# Patient Record
Sex: Male | Born: 1950 | Race: Black or African American | Hispanic: No | Marital: Single | Smoking: Current every day smoker
Health system: Southern US, Community
[De-identification: ages and names within clinical notes are randomized; demographics above are authoritative.]

## PROBLEM LIST (undated history)

## (undated) DIAGNOSIS — I252 Old myocardial infarction: Secondary | ICD-10-CM

## (undated) DIAGNOSIS — W3400XA Accidental discharge from unspecified firearms or gun, initial encounter: Secondary | ICD-10-CM

## (undated) DIAGNOSIS — J42 Unspecified chronic bronchitis: Secondary | ICD-10-CM

## (undated) DIAGNOSIS — J449 Chronic obstructive pulmonary disease, unspecified: Secondary | ICD-10-CM

## (undated) DIAGNOSIS — I639 Cerebral infarction, unspecified: Secondary | ICD-10-CM

## (undated) HISTORY — PX: BACK SURGERY: SHX140

## (undated) HISTORY — PX: ABDOMINAL SURGERY: SHX537

---

## 2019-09-03 ENCOUNTER — Emergency Department (HOSPITAL_COMMUNITY): Payer: Medicare Other

## 2019-09-03 ENCOUNTER — Other Ambulatory Visit: Payer: Self-pay

## 2019-09-03 ENCOUNTER — Emergency Department (HOSPITAL_COMMUNITY)
Admission: EM | Admit: 2019-09-03 | Discharge: 2019-09-03 | Disposition: A | Discharge status: Home / Self Care | Payer: Medicare Other | Attending: Emergency Medicine | Admitting: Emergency Medicine

## 2019-09-03 ENCOUNTER — Encounter (HOSPITAL_COMMUNITY): Payer: Self-pay

## 2019-09-03 DIAGNOSIS — J449 Chronic obstructive pulmonary disease, unspecified: Secondary | ICD-10-CM | POA: Insufficient documentation

## 2019-09-03 DIAGNOSIS — F17210 Nicotine dependence, cigarettes, uncomplicated: Secondary | ICD-10-CM | POA: Diagnosis not present

## 2019-09-03 DIAGNOSIS — R109 Unspecified abdominal pain: Secondary | ICD-10-CM | POA: Diagnosis not present

## 2019-09-03 HISTORY — DX: Cerebral infarction, unspecified: I63.9

## 2019-09-03 HISTORY — DX: Chronic obstructive pulmonary disease, unspecified: J44.9

## 2019-09-03 HISTORY — DX: Old myocardial infarction: I25.2

## 2019-09-03 HISTORY — DX: Unspecified chronic bronchitis: J42

## 2019-09-03 HISTORY — DX: Accidental discharge from unspecified firearms or gun, initial encounter: W34.00XA

## 2019-09-03 LAB — URINALYSIS, ROUTINE W REFLEX MICROSCOPIC
Bilirubin Urine: NEGATIVE
Glucose, UA: NEGATIVE mg/dL
Hgb urine dipstick: NEGATIVE
Ketones, ur: NEGATIVE mg/dL
Leukocytes,Ua: NEGATIVE
Nitrite: NEGATIVE
Protein, ur: NEGATIVE mg/dL
Specific Gravity, Urine: 1.01 (ref 1.005–1.030)
pH: 6 (ref 5.0–8.0)

## 2019-09-03 LAB — CBC WITH DIFFERENTIAL/PLATELET
Abs Immature Granulocytes: 0.02 10*3/uL (ref 0.00–0.07)
Basophils Absolute: 0 10*3/uL (ref 0.0–0.1)
Basophils Relative: 0 %
Eosinophils Absolute: 0.1 10*3/uL (ref 0.0–0.5)
Eosinophils Relative: 2 %
HCT: 39.4 % (ref 39.0–52.0)
Hemoglobin: 13.2 g/dL (ref 13.0–17.0)
Immature Granulocytes: 0 %
Lymphocytes Relative: 34 %
Lymphs Abs: 2.3 10*3/uL (ref 0.7–4.0)
MCH: 30.6 pg (ref 26.0–34.0)
MCHC: 33.5 g/dL (ref 30.0–36.0)
MCV: 91.2 fL (ref 80.0–100.0)
Monocytes Absolute: 1 10*3/uL (ref 0.1–1.0)
Monocytes Relative: 15 %
Neutro Abs: 3.2 10*3/uL (ref 1.7–7.7)
Neutrophils Relative %: 49 %
Platelets: 165 10*3/uL (ref 150–400)
RBC: 4.32 MIL/uL (ref 4.22–5.81)
RDW: 14.3 % (ref 11.5–15.5)
WBC: 6.7 10*3/uL (ref 4.0–10.5)
nRBC: 0 % (ref 0.0–0.2)

## 2019-09-03 LAB — BASIC METABOLIC PANEL
Anion gap: 9 (ref 5–15)
BUN: 12 mg/dL (ref 8–23)
CO2: 27 mmol/L (ref 22–32)
Calcium: 8.9 mg/dL (ref 8.9–10.3)
Chloride: 107 mmol/L (ref 98–111)
Creatinine, Ser: 0.71 mg/dL (ref 0.61–1.24)
GFR calc Af Amer: >60 mL/min (ref >60)
GFR calc non Af Amer: >60 mL/min (ref >60)
Glucose, Bld: 83 mg/dL (ref 70–99)
Potassium: 3.5 mmol/L (ref 3.5–5.1)
Sodium: 143 mmol/L (ref 135–145)

## 2019-09-03 MED ORDER — IBUPROFEN 800 MG PO TABS
800.0000 mg | ORAL_TABLET | Freq: Once | ORAL | Status: AC
  Administered 2019-09-03: 800 mg via ORAL
  Filled 2019-09-03: qty 1
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
## 2019-09-09 ENCOUNTER — Other Ambulatory Visit: Payer: Self-pay

## 2019-09-09 ENCOUNTER — Encounter (HOSPITAL_COMMUNITY): Payer: Self-pay

## 2019-09-09 ENCOUNTER — Emergency Department (HOSPITAL_COMMUNITY): Payer: Medicare Other

## 2019-09-09 ENCOUNTER — Emergency Department (HOSPITAL_COMMUNITY)
Admission: EM | Admit: 2019-09-09 | Discharge: 2019-09-09 | Disposition: A | Discharge status: Home / Self Care | Payer: Medicare Other | Attending: Emergency Medicine | Admitting: Emergency Medicine

## 2019-09-09 DIAGNOSIS — R1012 Left upper quadrant pain: Secondary | ICD-10-CM | POA: Diagnosis not present

## 2019-09-09 DIAGNOSIS — J449 Chronic obstructive pulmonary disease, unspecified: Secondary | ICD-10-CM | POA: Diagnosis not present

## 2019-09-09 DIAGNOSIS — M549 Dorsalgia, unspecified: Secondary | ICD-10-CM | POA: Diagnosis not present

## 2019-09-09 DIAGNOSIS — I252 Old myocardial infarction: Secondary | ICD-10-CM | POA: Insufficient documentation

## 2019-09-09 DIAGNOSIS — Z8673 Personal history of transient ischemic attack (TIA), and cerebral infarction without residual deficits: Secondary | ICD-10-CM | POA: Insufficient documentation

## 2019-09-09 DIAGNOSIS — F17210 Nicotine dependence, cigarettes, uncomplicated: Secondary | ICD-10-CM | POA: Insufficient documentation

## 2019-09-09 DIAGNOSIS — R109 Unspecified abdominal pain: Secondary | ICD-10-CM

## 2019-09-09 LAB — CBC
HCT: 40 % (ref 39.0–52.0)
Hemoglobin: 13.7 g/dL (ref 13.0–17.0)
MCH: 31.5 pg (ref 26.0–34.0)
MCHC: 34.3 g/dL (ref 30.0–36.0)
MCV: 92 fL (ref 80.0–100.0)
Platelets: 139 10*3/uL — ABNORMAL LOW (ref 150–400)
RBC: 4.35 MIL/uL (ref 4.22–5.81)
RDW: 14.7 % (ref 11.5–15.5)
WBC: 9.8 10*3/uL (ref 4.0–10.5)
nRBC: 0 % (ref 0.0–0.2)

## 2019-09-09 LAB — COMPREHENSIVE METABOLIC PANEL
ALT: 13 U/L (ref 0–44)
AST: 21 U/L (ref 15–41)
Albumin: 3.3 g/dL — ABNORMAL LOW (ref 3.5–5.0)
Alkaline Phosphatase: 67 U/L (ref 38–126)
Anion gap: 8 (ref 5–15)
BUN: 10 mg/dL (ref 8–23)
CO2: 23 mmol/L (ref 22–32)
Calcium: 8.5 mg/dL — ABNORMAL LOW (ref 8.9–10.3)
Chloride: 110 mmol/L (ref 98–111)
Creatinine, Ser: 0.72 mg/dL (ref 0.61–1.24)
GFR calc Af Amer: >60 mL/min (ref >60)
GFR calc non Af Amer: >60 mL/min (ref >60)
Glucose, Bld: 102 mg/dL — ABNORMAL HIGH (ref 70–99)
Potassium: 4.7 mmol/L (ref 3.5–5.1)
Sodium: 141 mmol/L (ref 135–145)
Total Bilirubin: 0.7 mg/dL (ref 0.3–1.2)
Total Protein: 6.9 g/dL (ref 6.5–8.1)

## 2019-09-09 LAB — URINALYSIS, ROUTINE W REFLEX MICROSCOPIC
Bilirubin Urine: NEGATIVE
Glucose, UA: NEGATIVE mg/dL
Hgb urine dipstick: NEGATIVE
Ketones, ur: NEGATIVE mg/dL
Leukocytes,Ua: NEGATIVE
Nitrite: NEGATIVE
Protein, ur: NEGATIVE mg/dL
Specific Gravity, Urine: 1.02 (ref 1.005–1.030)
pH: 7 (ref 5.0–8.0)

## 2019-09-09 LAB — LIPASE, BLOOD: Lipase: 183 U/L — ABNORMAL HIGH (ref 11–51)

## 2019-09-09 MED ORDER — IOHEXOL 300 MG/ML  SOLN
100.0000 mL | Freq: Once | INTRAMUSCULAR | Status: AC | PRN
  Administered 2019-09-09: 100 mL via INTRAVENOUS

## 2019-09-09 MED ORDER — PREDNISONE 50 MG PO TABS
ORAL_TABLET | ORAL | 0 refills | Status: DC
Start: 2019-09-09 — End: 2019-09-16

## 2019-09-09 MED ORDER — METHOCARBAMOL 1000 MG/10ML IJ SOLN
1000.0000 mg | Freq: Once | INTRAVENOUS | Status: AC
  Administered 2019-09-09: 1000 mg via INTRAVENOUS
  Filled 2019-09-09: qty 1000

## 2019-09-09 MED ORDER — SODIUM CHLORIDE (PF) 0.9 % IJ SOLN
INTRAMUSCULAR | Status: AC
  Filled 2019-09-09: qty 50

## 2019-09-09 MED ORDER — KETOROLAC TROMETHAMINE 30 MG/ML IJ SOLN
15.0000 mg | Freq: Once | INTRAMUSCULAR | Status: AC
  Administered 2019-09-09: 15 mg via INTRAVENOUS
  Filled 2019-09-09: qty 1

## 2019-09-09 MED ORDER — SODIUM CHLORIDE 0.9% FLUSH
3.0000 mL | Freq: Once | INTRAVENOUS | Status: AC
  Administered 2019-09-09: 3 mL via INTRAVENOUS
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
## 2019-09-09 NOTE — ED Triage Notes (Signed)
Patient c/o left upper abdominal pain that has been worsening x 3 weeks. Patient also c/o N/D.

## 2019-09-14 ENCOUNTER — Ambulatory Visit (HOSPITAL_COMMUNITY)
Admission: RE | Admit: 2019-09-14 | Discharge: 2019-09-14 | Disposition: A | Discharge status: Home / Self Care | Payer: Medicare Other | Source: Home / Self Care | Attending: Psychiatry | Admitting: Psychiatry

## 2019-09-14 ENCOUNTER — Encounter (HOSPITAL_COMMUNITY): Payer: Self-pay | Admitting: *Deleted

## 2019-09-14 ENCOUNTER — Other Ambulatory Visit: Payer: Self-pay

## 2019-09-14 ENCOUNTER — Emergency Department (HOSPITAL_COMMUNITY)
Admission: EM | Admit: 2019-09-14 | Discharge: 2019-09-15 | Disposition: A | Discharge status: Other Acute Inpatient Hospital | Payer: Medicare Other | Attending: Emergency Medicine | Admitting: Emergency Medicine

## 2019-09-14 DIAGNOSIS — F314 Bipolar disorder, current episode depressed, severe, without psychotic features: Secondary | ICD-10-CM | POA: Insufficient documentation

## 2019-09-14 DIAGNOSIS — J449 Chronic obstructive pulmonary disease, unspecified: Secondary | ICD-10-CM | POA: Insufficient documentation

## 2019-09-14 DIAGNOSIS — F17210 Nicotine dependence, cigarettes, uncomplicated: Secondary | ICD-10-CM | POA: Insufficient documentation

## 2019-09-14 DIAGNOSIS — Z20822 Contact with and (suspected) exposure to covid-19: Secondary | ICD-10-CM | POA: Insufficient documentation

## 2019-09-14 DIAGNOSIS — I10 Essential (primary) hypertension: Secondary | ICD-10-CM | POA: Insufficient documentation

## 2019-09-14 DIAGNOSIS — Z8673 Personal history of transient ischemic attack (TIA), and cerebral infarction without residual deficits: Secondary | ICD-10-CM | POA: Insufficient documentation

## 2019-09-14 DIAGNOSIS — Z79899 Other long term (current) drug therapy: Secondary | ICD-10-CM | POA: Insufficient documentation

## 2019-09-14 DIAGNOSIS — R45851 Suicidal ideations: Secondary | ICD-10-CM

## 2019-09-14 DIAGNOSIS — I252 Old myocardial infarction: Secondary | ICD-10-CM | POA: Insufficient documentation

## 2019-09-14 LAB — COMPREHENSIVE METABOLIC PANEL
ALT: 13 U/L (ref 0–44)
AST: 14 U/L — ABNORMAL LOW (ref 15–41)
Albumin: 3.8 g/dL (ref 3.5–5.0)
Alkaline Phosphatase: 62 U/L (ref 38–126)
Anion gap: 10 (ref 5–15)
BUN: 9 mg/dL (ref 8–23)
CO2: 22 mmol/L (ref 22–32)
Calcium: 8.9 mg/dL (ref 8.9–10.3)
Chloride: 111 mmol/L (ref 98–111)
Creatinine, Ser: 0.65 mg/dL (ref 0.61–1.24)
GFR calc Af Amer: >60 mL/min (ref >60)
GFR calc non Af Amer: >60 mL/min (ref >60)
Glucose, Bld: 85 mg/dL (ref 70–99)
Potassium: 3.6 mmol/L (ref 3.5–5.1)
Sodium: 143 mmol/L (ref 135–145)
Total Bilirubin: 0.7 mg/dL (ref 0.3–1.2)
Total Protein: 7.5 g/dL (ref 6.5–8.1)

## 2019-09-14 LAB — RAPID URINE DRUG SCREEN, HOSP PERFORMED
Amphetamines: NOT DETECTED
Barbiturates: NOT DETECTED
Benzodiazepines: NOT DETECTED
Cocaine: NOT DETECTED
Opiates: NOT DETECTED
Tetrahydrocannabinol: NOT DETECTED

## 2019-09-14 LAB — CBC
HCT: 41.5 % (ref 39.0–52.0)
Hemoglobin: 14.4 g/dL (ref 13.0–17.0)
MCH: 31 pg (ref 26.0–34.0)
MCHC: 34.7 g/dL (ref 30.0–36.0)
MCV: 89.4 fL (ref 80.0–100.0)
Platelets: 134 10*3/uL — ABNORMAL LOW (ref 150–400)
RBC: 4.64 MIL/uL (ref 4.22–5.81)
RDW: 14.3 % (ref 11.5–15.5)
WBC: 7.2 10*3/uL (ref 4.0–10.5)
nRBC: 0 % (ref 0.0–0.2)

## 2019-09-14 LAB — SALICYLATE LEVEL: Salicylate Lvl: <7 mg/dL — ABNORMAL LOW (ref 7.0–30.0)

## 2019-09-14 LAB — ETHANOL: Alcohol, Ethyl (B): <10 mg/dL (ref <10)

## 2019-09-14 LAB — ACETAMINOPHEN LEVEL: Acetaminophen (Tylenol), Serum: <10 ug/mL — ABNORMAL LOW (ref 10–30)

## 2019-09-14 MED ORDER — QUETIAPINE FUMARATE ER 200 MG PO TB24
400.0000 mg | ORAL_TABLET | Freq: Every day | ORAL | Status: DC
  Filled 2019-09-14: qty 2

## 2019-09-14 MED ORDER — PRAZOSIN HCL 1 MG PO CAPS
1.0000 mg | ORAL_CAPSULE | Freq: Every day | ORAL | Status: DC
  Filled 2019-09-14: qty 1

## 2019-09-14 MED ORDER — QUETIAPINE FUMARATE ER 200 MG PO TB24
200.0000 mg | ORAL_TABLET | Freq: Every day | ORAL | Status: DC
  Filled 2019-09-14: qty 1
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
## 2019-09-14 NOTE — ED Notes (Addendum)
Pt expresses disatisfaction with the "bright, fucking lights." He has been offered a blanket to cover his eyes with.

## 2019-09-14 NOTE — ED Triage Notes (Signed)
Pt reporting that he is suicidal and would "take a scissors and cut all my veins out". He says she just got discharged from a mental hospital and has ran out of his medications he was prescribed. He has been out of his meds for 4 days and says he has been suicidal since.

## 2019-09-14 NOTE — ED Notes (Signed)
Pt refusing EKG, says he just wants to be left alone, does not want anything done, wants his medications and to go home EDP notified

## 2019-09-15 ENCOUNTER — Inpatient Hospital Stay (HOSPITAL_COMMUNITY)
Admit: 2019-09-15 | Discharge: 2019-09-16 | DRG: 885 | Disposition: A | Discharge status: Home / Self Care | Payer: Medicare Other | Source: Intra-hospital | Attending: Psychiatry | Admitting: Psychiatry

## 2019-09-15 DIAGNOSIS — F17210 Nicotine dependence, cigarettes, uncomplicated: Secondary | ICD-10-CM | POA: Diagnosis present

## 2019-09-15 DIAGNOSIS — I252 Old myocardial infarction: Secondary | ICD-10-CM | POA: Diagnosis not present

## 2019-09-15 DIAGNOSIS — J449 Chronic obstructive pulmonary disease, unspecified: Secondary | ICD-10-CM | POA: Diagnosis present

## 2019-09-15 DIAGNOSIS — F209 Schizophrenia, unspecified: Secondary | ICD-10-CM | POA: Diagnosis present

## 2019-09-15 DIAGNOSIS — Z79899 Other long term (current) drug therapy: Secondary | ICD-10-CM | POA: Diagnosis not present

## 2019-09-15 DIAGNOSIS — F314 Bipolar disorder, current episode depressed, severe, without psychotic features: Principal | ICD-10-CM | POA: Diagnosis present

## 2019-09-15 DIAGNOSIS — R45851 Suicidal ideations: Secondary | ICD-10-CM | POA: Diagnosis present

## 2019-09-15 DIAGNOSIS — Z823 Family history of stroke: Secondary | ICD-10-CM | POA: Diagnosis not present

## 2019-09-15 DIAGNOSIS — Z888 Allergy status to other drugs, medicaments and biological substances status: Secondary | ICD-10-CM | POA: Diagnosis not present

## 2019-09-15 DIAGNOSIS — Z8673 Personal history of transient ischemic attack (TIA), and cerebral infarction without residual deficits: Secondary | ICD-10-CM | POA: Diagnosis not present

## 2019-09-15 LAB — SARS CORONAVIRUS 2 BY RT PCR (HOSPITAL ORDER, PERFORMED IN ~~LOC~~ HOSPITAL LAB): SARS Coronavirus 2: NEGATIVE

## 2019-09-15 MED ORDER — QUETIAPINE FUMARATE ER 200 MG PO TB24: 200.0000 mg | ORAL_TABLET | Freq: Every day | ORAL | Status: DC

## 2019-09-15 MED ORDER — PRAZOSIN HCL 1 MG PO CAPS
1.0000 mg | ORAL_CAPSULE | Freq: Every day | ORAL | Status: DC
  Administered 2019-09-15 (×2): 1 mg via ORAL
  Filled 2019-09-15 (×4): qty 1

## 2019-09-15 MED ORDER — QUETIAPINE FUMARATE ER 400 MG PO TB24
400.0000 mg | ORAL_TABLET | Freq: Every day | ORAL | Status: DC
  Administered 2019-09-15 (×2): 400 mg via ORAL
  Filled 2019-09-15 (×3): qty 1

## 2019-09-15 MED ORDER — DILTIAZEM HCL ER COATED BEADS 180 MG PO CP24
180.0000 mg | ORAL_CAPSULE | Freq: Every day | ORAL | Status: DC
  Administered 2019-09-15 – 2019-09-16 (×2): 180 mg via ORAL
  Filled 2019-09-15 (×4): qty 1

## 2019-09-15 MED ORDER — PROPRANOLOL HCL 10 MG PO TABS
10.0000 mg | ORAL_TABLET | Freq: Two times a day (BID) | ORAL | Status: DC
  Filled 2019-09-15 (×3): qty 1

## 2019-09-15 MED ORDER — ATORVASTATIN CALCIUM 80 MG PO TABS
80.0000 mg | ORAL_TABLET | Freq: Every day | ORAL | Status: DC
  Administered 2019-09-15 – 2019-09-16 (×2): 80 mg via ORAL
  Filled 2019-09-15 (×4): qty 1

## 2019-09-15 MED ORDER — HYDROXYZINE HCL 25 MG PO TABS: 25.0000 mg | ORAL_TABLET | Freq: Three times a day (TID) | ORAL | Status: DC | PRN

## 2019-09-15 MED ORDER — QUETIAPINE FUMARATE ER 200 MG PO TB24
200.0000 mg | ORAL_TABLET | Freq: Every day | ORAL | Status: DC
  Administered 2019-09-15: 200 mg via ORAL
  Filled 2019-09-15 (×3): qty 1
  Filled 2019-09-15: qty 2

## 2019-09-15 MED ORDER — ALUM & MAG HYDROXIDE-SIMETH 200-200-20 MG/5ML PO SUSP: 30.0000 mL | ORAL | Status: DC | PRN

## 2019-09-15 MED ORDER — DIVALPROEX SODIUM ER 500 MG PO TB24
2000.0000 mg | ORAL_TABLET | Freq: Every day | ORAL | Status: DC
  Administered 2019-09-15 (×2): 2000 mg via ORAL
  Filled 2019-09-15 (×4): qty 4

## 2019-09-15 MED ORDER — PANTOPRAZOLE SODIUM 40 MG PO TBEC
40.0000 mg | DELAYED_RELEASE_TABLET | Freq: Every day | ORAL | Status: DC
  Administered 2019-09-15 – 2019-09-16 (×2): 40 mg via ORAL
  Filled 2019-09-15 (×4): qty 1

## 2019-09-15 MED ORDER — TRAZODONE HCL 50 MG PO TABS: 50.0000 mg | ORAL_TABLET | Freq: Every evening | ORAL | Status: DC | PRN

## 2019-09-15 MED ORDER — MAGNESIUM HYDROXIDE 400 MG/5ML PO SUSP: 30.0000 mL | Freq: Every day | ORAL | Status: DC | PRN

## 2019-09-15 MED ORDER — ACETAMINOPHEN 325 MG PO TABS: 650.0000 mg | ORAL_TABLET | Freq: Four times a day (QID) | ORAL | Status: DC | PRN

## 2019-09-15 MED ORDER — CLOPIDOGREL BISULFATE 75 MG PO TABS
75.0000 mg | ORAL_TABLET | Freq: Every day | ORAL | Status: DC
  Administered 2019-09-15 – 2019-09-16 (×2): 75 mg via ORAL
  Filled 2019-09-15 (×4): qty 1

## 2019-09-15 NOTE — BHH Group Notes (Signed)
Adult Psychoeducational Group Note  Date:  09/15/2019  Time:  9:00am-9:45am  Group Topic/Focus: PROGRESSIVE RELAXATION. A group where deep breathing is taught and tensing and relaxation muscle groups is used. Imagery is used as well.  Pts are asked to imagine 3 pillars that hold them up when they are not able to hold themselves up.  Participation Level:  Did Not Attend   Dione Housekeeper 12:59 PM

## 2019-09-15 NOTE — Progress Notes (Signed)
   09/15/19 2205  COVID-19 Daily Checkoff  Have you had a fever (temp > 37.80C/100F)  in the past 24 hours?  No  If you have had runny nose, nasal congestion, sneezing in the past 24 hours, has it worsened? No  COVID-19 EXPOSURE  Have you traveled outside the state in the past 14 days? No  Have you been in contact with someone with a confirmed diagnosis of COVID-19 or PUI in the past 14 days without wearing appropriate PPE? No  Have you been living in the same home as a person with confirmed diagnosis of COVID-19 or a PUI (household contact)? No  Have you been diagnosed with COVID-19? No
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
## 2019-09-15 NOTE — Progress Notes (Signed)
Pt a transfer from [facility] ED under IVC status with c/o of Suicidal ideation. patient states he was recently discharged from a mental health facility in [place] and dischrged with 14 days worth of med's. Pt initially arrived to [facility] requesting prescriptions as he had run out of his med's. pt told he would have to be evaluated. Pt left [facility] and went to [facility] again requesting prescriptions they also said he would have to be evaluated patient then became irritable and agitated and endorsing suicidal ideation with plan to cut out his veins and walk into traffic. Patient Involuntarily committed by Ed Physician. Patient Denies AVH and pain at this time. Denies substance use at this time. States "when I get out of here I'm going to buy crack and kill myself I dont want to be here anymore. I hope I have a heart attck or a stroke tonight adjust die." Skin assessment completed and belonging searched per protocol. Items deemed contraband secured in locker. Q 15 minutes safety checks initiated without self harm gestures. Writer encouraged pt to voice concerns. Pt remains safe on unit. patient agitated, irritable, and uncooperative with admission process. Patient states " I just want to fucking go to sleep".

## 2019-09-15 NOTE — Plan of Care (Signed)
Progress note  D: pt found in bed; allowed to rest. Pt was stated as being irritable, anxious and guarded in report. Pt stated that they wanted to rest. At lunch the pt presented anxious, angry, and ambivalent for medication pass. Pt was compliant though. Pt's BP continues to trend high. Pt also complains of L flank pain that they rate a 10/10 that has been chronic before coming to the hospital. Pt declined medication, stating nothing helps. Pt is resting now. Pt continues to be reclusive to their room. Pt denies si/hi/ah/vh and verbally agrees to approach staff if these become apparent or before harming themself/others while at bhh.  A: Pt provided support and encouragement. Pt given medication per protocol and standing orders. Q89m safety checks implemented and continued.  R: Pt safe on the unit. Will continue to monitor.  Pt progressing in the following metrics  Problem: Education: Goal: Ability to state activities that reduce stress will improve Outcome: Progressing   Problem: Education: Goal: Knowledge of [facility] Education information/materials will improve Outcome: Progressing Goal: Emotional status will improve Outcome: Progressing

## 2019-09-15 NOTE — Tx Team (Signed)
Initial Treatment Plan 09/15/2019 2:56 AM Samuel Morrison [id number]    PATIENT STRESSORS: Financial difficulties Lack of access ot Medication  Homeless   PATIENT STRENGTHS: Physical Health   PATIENT IDENTIFIED PROBLEMS: Suicidal ideation with plan to cut veins out of wrist  Depressive symptoms: hopeless, helpless  Medication refill                 DISCHARGE CRITERIA:  Ability to meet basic life and health needs  Decrease in suicidal ideation     PRELIMINARY DISCHARGE PLAN: Return to previous living arrangement  PATIENT/FAMILY INVOLVEMENT: This treatment plan has been presented to and reviewed with the patient, Samuel Morrison, and/or family member.  The patient and family have been given the opportunity to ask questions and make suggestions.  Normajean Baxter, RN 09/15/2019, 2:56 AM

## 2019-09-15 NOTE — BHH Group Notes (Signed)
B[facility] LCSW Group Therapy Note  Date/Time:  09/15/2019  11:00AM-12:00PM  Type of Therapy and Topic:  Group Therapy:  Music and Mood  Participation Level:  Did Not Attend   Description of Group: In this process group, members listened to a variety of genres of music and identified that different types of music evoke different responses.  Patients were encouraged to identify music that was soothing for them and music that was energizing for them.  Patients discussed how this knowledge can help with wellness and recovery in various ways including managing depression and anxiety as well as encouraging healthy sleep habits.    Therapeutic Goals: Patients will explore the impact of different varieties of music on mood Patients will verbalize the thoughts they have when listening to different types of music Patients will identify music that is soothing to them as well as music that is energizing to them Patients will discuss how to use this knowledge to assist in maintaining wellness and recovery Patients will explore the use of music as a coping skill  Summary of Patient Progress:  Patient did not attend  Therapeutic Modalities: Solution Focused Brief Therapy Activity   Charlese Gruetzmacher [place]rr, LCSW    
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
## 2019-09-15 NOTE — Progress Notes (Signed)
°   09/15/19 2211  Psych Admission Type (Psych Patients Only)  Admission Status Involuntary  Psychosocial Assessment  Patient Complaints Anxiety  Eye Contact Brief  Facial Expression Pensive  Affect Appropriate to circumstance  Speech Logical/coherent  Interaction Assertive;Guarded  Motor Activity Restless  Appearance/Hygiene In scrubs  Behavior Characteristics Appropriate to situation  Mood Irritable  Thought Process  Coherency WDL  Content WDL  Delusions None reported or observed  Perception WDL  Hallucination None reported or observed  Judgment Poor  Confusion None  Danger to Self  Current suicidal ideation? Denies  Self-Injurious Behavior No self-injurious ideation or behavior indicators observed or expressed   Agreement Not to Harm Self Yes  Description of Agreement verbal  Danger to Others  Danger to Others None reported or observed

## 2019-09-16 LAB — HEMOGLOBIN A1C
Hgb A1c MFr Bld: 6 % — ABNORMAL HIGH (ref 4.8–5.6)
Mean Plasma Glucose: 125.5 mg/dL

## 2019-09-16 LAB — LIPID PANEL
Cholesterol: 107 mg/dL (ref 0–200)
HDL: 53 mg/dL (ref >40)
LDL Cholesterol: 35 mg/dL (ref 0–99)
Total CHOL/HDL Ratio: 2 RATIO
Triglycerides: 97 mg/dL (ref <150)
VLDL: 19 mg/dL (ref 0–40)

## 2019-09-16 LAB — TSH: TSH: 2.062 u[IU]/mL (ref 0.350–4.500)

## 2019-09-16 LAB — VALPROIC ACID LEVEL: Valproic Acid Lvl: 96 ug/mL (ref 50.0–100.0)

## 2019-09-16 MED ORDER — DIVALPROEX SODIUM ER 500 MG PO TB24: 2000.0000 mg | ORAL_TABLET | Freq: Every day | ORAL | 0 refills | Status: DC

## 2019-09-16 MED ORDER — CLOPIDOGREL BISULFATE 75 MG PO TABS: 75.0000 mg | ORAL_TABLET | Freq: Every day | ORAL | 0 refills | Status: AC

## 2019-09-16 MED ORDER — OMEPRAZOLE 40 MG PO CPDR: 40.0000 mg | DELAYED_RELEASE_CAPSULE | Freq: Every day | ORAL | 0 refills | Status: AC

## 2019-09-16 MED ORDER — PRAZOSIN HCL 1 MG PO CAPS: 1.0000 mg | ORAL_CAPSULE | Freq: Every day | ORAL | 0 refills | Status: AC

## 2019-09-16 MED ORDER — QUETIAPINE FUMARATE ER 400 MG PO TB24: 400.0000 mg | ORAL_TABLET | Freq: Every day | ORAL | 0 refills | Status: DC

## 2019-09-16 MED ORDER — ATORVASTATIN CALCIUM 80 MG PO TABS: 80.0000 mg | ORAL_TABLET | Freq: Every day | ORAL | 0 refills | Status: AC

## 2019-09-16 MED ORDER — DILTIAZEM HCL ER COATED BEADS 180 MG PO CP24: 180.0000 mg | ORAL_CAPSULE | Freq: Every day | ORAL | 0 refills | Status: AC

## 2019-09-16 MED ORDER — QUETIAPINE FUMARATE ER 200 MG PO TB24: 200.0000 mg | ORAL_TABLET | Freq: Every day | ORAL | 0 refills | Status: DC

## 2019-09-16 NOTE — Progress Notes (Signed)
Recreation Therapy Notes  Date: 6.14.21 Time: 1000 Location: 500 Hall Dayroom  Group Topic: Communication  Goal Area(s) Addresses:  Patient will identify biggest triggers. Patient will identify strategies to avoid exposure to triggers Patient will identify strategies to deal with triggers head on.  Intervention: Worksheet, pencils   Activity: Triggers.  Patients were to identify their biggest triggers.  Patients then identified ways to avoid those triggers and identify strategies to deal with triggers head on when unable to be avoided.  Education: Communication, Discharge Planning  Education Outcome: Acknowledges understanding/In group clarification offered/Needs additional education.   Clinical Observations/Feedback: Pt did not attend group session.    Caroll Rancher, LRT/CTRS     Caroll Rancher A 09/16/2019 12:20 PM
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
## 2019-09-16 NOTE — Tx Team (Signed)
Interdisciplinary Treatment and Diagnostic Plan Update  09/16/2019 Time of Session: 9:45am Samuel Morrison MRN: [id number]  Principal Diagnosis: Bipolar 1 disorder, depressed, severe ([place])  Secondary Diagnoses: Principal Problem:   Bipolar 1 disorder, depressed, severe ([place])   Current Medications:  Current Facility-Administered Medications  Medication Dose Route Frequency Provider Last Rate Last Admin  . acetaminophen (TYLENOL) tablet 650 mg  650 mg Oral Q6H PRN Lindon Romp A, NP      . alum & mag hydroxide-simeth (MAALOX/MYLANTA) 200-200-20 MG/5ML suspension 30 mL  30 mL Oral Q4H PRN Lindon Romp A, NP      . atorvastatin (LIPITOR) tablet 80 mg  80 mg Oral Daily Lindon Romp A, NP   80 mg at 09/16/19 0923  . clopidogrel (PLAVIX) tablet 75 mg  75 mg Oral Daily Money, Lowry Ram, FNP   75 mg at 09/16/19 0924  . diltiazem (CARDIZEM CD) 24 hr capsule 180 mg  180 mg Oral Daily Money, Lowry Ram, FNP   180 mg at 09/16/19 0924  . divalproex (DEPAKOTE ER) 24 hr tablet 2,000 mg  2,000 mg Oral QHS Lindon Romp A, NP   2,000 mg at 09/15/19 2046  . hydrOXYzine (ATARAX/VISTARIL) tablet 25 mg  25 mg Oral TID PRN Lindon Romp A, NP      . magnesium hydroxide (MILK OF MAGNESIA) suspension 30 mL  30 mL Oral Daily PRN Lindon Romp A, NP      . pantoprazole (PROTONIX) EC tablet 40 mg  40 mg Oral Daily Lindon Romp A, NP   40 mg at 09/16/19 0924  . prazosin (MINIPRESS) capsule 1 mg  1 mg Oral QHS Lindon Romp A, NP   1 mg at 09/15/19 2047  . QUEtiapine (SEROQUEL XR) 24 hr tablet 400 mg  400 mg Oral QHS Lindon Romp A, NP   400 mg at 09/15/19 2046   PTA Medications: Medications Prior to Admission  Medication Sig Dispense Refill Last Dose  . predniSONE (DELTASONE) 50 MG tablet 1 p.o. daily x5 5 tablet 0   . propranolol (INDERAL) 10 MG tablet Take 10 mg by mouth 2 (two) times daily.     . [DISCONTINUED] atorvastatin (LIPITOR) 80 MG tablet Take 80 mg by mouth daily.     . [DISCONTINUED] divalproex (DEPAKOTE  ER) 500 MG 24 hr tablet Take 2,000 mg by mouth at bedtime.     . [DISCONTINUED] omeprazole (PRILOSEC) 40 MG capsule Take 40 mg by mouth daily.     . [DISCONTINUED] prazosin (MINIPRESS) 1 MG capsule Take 1 mg by mouth at bedtime.     . [DISCONTINUED] QUEtiapine (SEROQUEL XR) 200 MG 24 hr tablet Take 200 mg by mouth at bedtime.     . [DISCONTINUED] QUEtiapine (SEROQUEL XR) 400 MG 24 hr tablet Take 400 mg by mouth at bedtime.       Patient Stressors:    Patient Strengths: Physical Health  Treatment Modalities: Medication Management, Group therapy, Case management,  1 to 1 session with clinician, Psychoeducation, Recreational therapy.   Physician Treatment Plan for Primary Diagnosis: Bipolar 1 disorder, depressed, severe ([place]) Long Term Goal(s): Improvement in symptoms so as ready for discharge Improvement in symptoms so as ready for discharge   Short Term Goals: Ability to identify changes in lifestyle to reduce recurrence of condition will improve Ability to verbalize feelings will improve Ability to disclose and discuss suicidal ideas Ability to demonstrate self-control will improve Ability to identify and develop effective coping behaviors will improve Ability to maintain clinical measurements  within normal limits will improve Compliance with prescribed medications will improve Ability to identify changes in lifestyle to reduce recurrence of condition will improve Ability to verbalize feelings will improve Ability to disclose and discuss suicidal ideas Ability to demonstrate self-control will improve Ability to identify and develop effective coping behaviors will improve Ability to maintain clinical measurements within normal limits will improve Compliance with prescribed medications will improve  Medication Management: Evaluate patient's response, side effects, and tolerance of medication regimen.  Therapeutic Interventions: 1 to 1 sessions, Unit Group sessions and Medication  administration.  Evaluation of Outcomes: Adequate for Discharge  Physician Treatment Plan for Secondary Diagnosis: Principal Problem:   Bipolar 1 disorder, depressed, severe ([facility])  Long Term Goal(s): Improvement in symptoms so as ready for discharge Improvement in symptoms so as ready for discharge   Short Term Goals: Ability to identify changes in lifestyle to reduce recurrence of condition will improve Ability to verbalize feelings will improve Ability to disclose and discuss suicidal ideas Ability to demonstrate self-control will improve Ability to identify and develop effective coping behaviors will improve Ability to maintain clinical measurements within normal limits will improve Compliance with prescribed medications will improve Ability to identify changes in lifestyle to reduce recurrence of condition will improve Ability to verbalize feelings will improve Ability to disclose and discuss suicidal ideas Ability to demonstrate self-control will improve Ability to identify and develop effective coping behaviors will improve Ability to maintain clinical measurements within normal limits will improve Compliance with prescribed medications will improve     Medication Management: Evaluate patient's response, side effects, and tolerance of medication regimen.  Therapeutic Interventions: 1 to 1 sessions, Unit Group sessions and Medication administration.  Evaluation of Outcomes: Adequate for Discharge   RN Treatment Plan for Primary Diagnosis: Bipolar 1 disorder, depressed, severe (H[facility]) Long Term Goal(s): Knowledge of disease and therapeutic regimen to maintain health will improve  Short Term Goals: Ability to disclose and discuss suicidal ideas and Compliance with prescribed medications will improve  Medication Management: RN will administer medications as ordered by provider, will assess and evaluate patient's response and provide education to patient for prescribed medication.  RN will report any adverse and/or side effects to prescribing provider.  Therapeutic Interventions: 1 on 1 counseling sessions, Psychoeducation, Medication administration, Evaluate responses to treatment, Monitor vital signs and CBGs as ordered, Perform/monitor CIWA, COWS, AIMS and Fall Risk screenings as ordered, Perform wound care treatments as ordered.  Evaluation of Outcomes: Adequate for Discharge   LCSW Treatment Plan for Primary Diagnosis: Bipolar 1 disorder, depressed, severe (HCC) Long Term Goal(s): Safe transition to appropriate next level of care at discharge, Engage patient in therapeutic group addressing interpersonal concerns.  Short Term Goals: Engage patient in aftercare planning with referrals and resources, Identify triggers associated with mental health/substance abuse issues and Increase skills for wellness and recovery  Therapeutic Interventions: Assess for all discharge needs, 1 to 1 time with Social worker, Explore available resources and support systems, Assess for adequacy in community support network, Educate family and significant other(s) on suicide prevention, Complete Psychosocial Assessment, Interpersonal group therapy.  Evaluation of Outcomes: Adequate for Discharge   Progress in Treatment: Attending groups: No. Participating in groups: No. Taking medication as prescribed: Yes. Toleration medication: Yes. Family/Significant other contact made: No, will contact:  Attempted to make contact with friend. Patient understands diagnosis: Yes. Discussing patient identified problems/goals with staff: Yes. Medical problems stabilized or resolved: Yes. Denies suicidal/homicidal ideation: Yes. Issues/concerns per patient self-inventory: No. Other: none  New problem(s) identified: None  New Short Term/Long Term Goal(s): medication stabilization, elimination of SI thoughts, development of comprehensive mental wellness plan.   Patient Goals:    Discharge Plan or  Barriers: Patient has follow up appointment at [facility] on 09/16/19 for medication management.   Reason for Continuation of Hospitalization: Anxiety Depression Medication stabilization  Estimated Length of Stay: Pt is adequate for discharge.  Attendees: Patient: 09/16/2019   Physician:  09/16/2019   Nursing:  09/16/2019   RN Care Manager: 09/16/2019   Social Worker: Ruthann Cancer, LCSWA 09/16/2019   Recreational Therapist:  09/16/2019  Other:  09/16/2019  Other:  09/16/2019  Other: 09/16/2019    Scribe for Treatment Team: Otelia Santee, LCSWA 09/16/2019 10:14 AM

## 2019-09-16 NOTE — Progress Notes (Signed)
°  [facility] Adult Case Management Discharge Plan :  Will you be returning to the same living situation after discharge:  Yes,  to [facility] [place] At discharge, do you have transportation home?: Yes,  [facility] to pick pt up.  Do you have the ability to pay for your medications: Yes,  has insurance.    Release of information consent forms completed and in the chart;  Patient's signature needed at discharge.  Patient to Follow up at:  Follow-up Information    [facility] Follow up on 09/17/2019.   Why: You have an appointment on 09/17/19 at 4:00 pm.  This will be a Heritage manager appointment. Contact information: [address]  [address] [place] [address] [phone]               Next level of care provider has access to [facility] Link:no  Safety Planning and Suicide Prevention discussed: Yes,  with patient.      Has patient been referred to the Quitline?: Patient refused referral  Patient has been referred for addiction treatment: Pt. refused referral  Otelia Santee, LCSWA 09/16/2019, 10:19 AM

## 2019-09-16 NOTE — Progress Notes (Signed)
Pt discharged to lobby. Pt was stable and appreciative at that time. All papers and prescriptions were given and valuables returned. Verbal understanding expressed. Denies SI/HI and A/VH. Pt given opportunity to express concerns and ask questions.  

## 2019-09-16 NOTE — [facility] Suicide Risk Assessment (Signed)
[facility] INPATIENT:  Family/Significant Other Suicide Prevention Education   Suicide Prevention Education:  Contact Attempts: Rennie Plowman [phone]), has been identified by the patient as the family member/significant other with whom the patient will be residing, and identified as the person(s) who will aid the patient in the event of a mental health crisis.  With written consent from the patient, two attempts were made to provide suicide prevention education, prior to and/or following the patient's discharge.  We were unsuccessful in providing suicide prevention education.  A suicide education pamphlet was given to the patient to share with family/significant other.   Date and time of first attempt: 09/16/19 CSW attempted to reach [facility] and was unable to leave a voicemail.   CSW unable to attempt to reach contacts due to this patient discharging within 24 hours of admission.    SPE completed with patient, as patient refused to consent to family contact. SPI pamphlet provided to pt and pt was encouraged to share information with support network, ask questions, and talk about any concerns relating to SPE. Patient denies access to guns/firearms and verbalized understanding of information provided. Mobile Crisis information also provided to patient.   Ruthann Cancer MSW, [employer] Clincal Social Worker  [facility]
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
## 2019-09-16 NOTE — [facility] Suicide Risk Assessment (Signed)
[facility] Discharge Suicide Risk Assessment   Principal Problem: Bipolar 1 disorder, depressed, severe (HCC) Discharge Diagnoses: Principal Problem:   Bipolar 1 disorder, depressed, severe (HCC)   Total Time spent with patient: 30 minutes  Musculoskeletal: Strength & Muscle Tone: within normal limits Gait & Station: normal Patient leans: N/A  Psychiatric Specialty Exam: Review of Systems  All other systems reviewed and are negative.   Blood pressure (!) 127/93, pulse (!) 111, temperature 97.9 F (36.6 C), temperature source Oral, resp. rate 18, height 5\' 9"  (1.753 m), weight 72 kg, SpO2 98 %.Body mass index is 23.44 kg/m.  General Appearance: Disheveled  Eye ::  Fair  Speech:  Normal Rate409  Volume:  Decreased  Mood:  Euthymic  Affect:  Congruent  Thought Process:  Coherent and Descriptions of Associations: Intact  Orientation:  Full (Time, Place, and Person)  Thought Content:  Logical  Suicidal Thoughts:  No  Homicidal Thoughts:  No  Memory:  Immediate;   Fair Recent;   Fair Remote;   Fair  Judgement:  Intact  Insight:  Fair  Psychomotor Activity:  Decreased  Concentration:  Fair  Recall:  [phone] of Knowledge:Fair  Language: Fair  Akathisia:  Negative  Handed:  Right  AIMS (if indicated):     Assets:  Desire for Improvement Resilience  Sleep:  Number of Hours: 5  Cognition: WNL  ADL's:  Intact   Mental Status Per Nursing Assessment::   On Admission:  Suicidal ideation indicated by patient  Demographic Factors:  Male, Age 1 or older, Low socioeconomic status, Living alone and Unemployed  Loss Factors: Financial problems/change in socioeconomic status  Historical Factors: Impulsivity  Risk Reduction Factors:   Positive coping skills or problem solving skills  Continued Clinical Symptoms:  Bipolar Disorder:   Mixed State  Cognitive Features That Contribute To Risk:  None    Suicide Risk:  Minimal: No identifiable suicidal ideation.   Patients presenting with no risk factors but with morbid ruminations; may be classified as minimal risk based on the severity of the depressive symptoms    Plan Of Care/Follow-up recommendations:  Activity:  ad lib  78, MD 09/16/2019, 8:14 AM

## 2019-10-08 ENCOUNTER — Inpatient Hospital Stay (HOSPITAL_COMMUNITY)
Admission: EM | Admit: 2019-10-08 | Discharge: 2019-10-15 | DRG: 918 | Disposition: A | Discharge status: Skilled Nursing Facility | Payer: Medicare HMO | Attending: Internal Medicine | Admitting: Internal Medicine

## 2019-10-08 DIAGNOSIS — Z8673 Personal history of transient ischemic attack (TIA), and cerebral infarction without residual deficits: Secondary | ICD-10-CM

## 2019-10-08 DIAGNOSIS — T43502A Poisoning by unspecified antipsychotics and neuroleptics, intentional self-harm, initial encounter: Secondary | ICD-10-CM

## 2019-10-08 DIAGNOSIS — N401 Enlarged prostate with lower urinary tract symptoms: Secondary | ICD-10-CM | POA: Diagnosis present

## 2019-10-08 DIAGNOSIS — T43592A Poisoning by other antipsychotics and neuroleptics, intentional self-harm, initial encounter: Principal | ICD-10-CM | POA: Diagnosis present

## 2019-10-08 DIAGNOSIS — F314 Bipolar disorder, current episode depressed, severe, without psychotic features: Secondary | ICD-10-CM | POA: Diagnosis present

## 2019-10-08 DIAGNOSIS — G2401 Drug induced subacute dyskinesia: Secondary | ICD-10-CM | POA: Diagnosis present

## 2019-10-08 DIAGNOSIS — T50902A Poisoning by unspecified drugs, medicaments and biological substances, intentional self-harm, initial encounter: Secondary | ICD-10-CM

## 2019-10-08 DIAGNOSIS — W3400XA Accidental discharge from unspecified firearms or gun, initial encounter: Secondary | ICD-10-CM

## 2019-10-08 DIAGNOSIS — Z79899 Other long term (current) drug therapy: Secondary | ICD-10-CM

## 2019-10-08 DIAGNOSIS — Z888 Allergy status to other drugs, medicaments and biological substances status: Secondary | ICD-10-CM

## 2019-10-08 DIAGNOSIS — Z915 Personal history of self-harm: Secondary | ICD-10-CM

## 2019-10-08 DIAGNOSIS — I252 Old myocardial infarction: Secondary | ICD-10-CM

## 2019-10-08 DIAGNOSIS — Z20822 Contact with and (suspected) exposure to covid-19: Secondary | ICD-10-CM | POA: Diagnosis present

## 2019-10-08 DIAGNOSIS — Z781 Physical restraint status: Secondary | ICD-10-CM

## 2019-10-08 DIAGNOSIS — Z823 Family history of stroke: Secondary | ICD-10-CM

## 2019-10-08 DIAGNOSIS — T426X1A Poisoning by other antiepileptic and sedative-hypnotic drugs, accidental (unintentional), initial encounter: Secondary | ICD-10-CM | POA: Diagnosis present

## 2019-10-08 DIAGNOSIS — T426X5A Adverse effect of other antiepileptic and sedative-hypnotic drugs, initial encounter: Secondary | ICD-10-CM | POA: Diagnosis present

## 2019-10-08 DIAGNOSIS — E876 Hypokalemia: Secondary | ICD-10-CM | POA: Diagnosis present

## 2019-10-08 DIAGNOSIS — I10 Essential (primary) hypertension: Secondary | ICD-10-CM | POA: Diagnosis present

## 2019-10-08 DIAGNOSIS — R001 Bradycardia, unspecified: Secondary | ICD-10-CM | POA: Diagnosis present

## 2019-10-08 DIAGNOSIS — F419 Anxiety disorder, unspecified: Secondary | ICD-10-CM | POA: Diagnosis present

## 2019-10-08 DIAGNOSIS — R45851 Suicidal ideations: Secondary | ICD-10-CM | POA: Diagnosis not present

## 2019-10-08 DIAGNOSIS — I493 Ventricular premature depolarization: Secondary | ICD-10-CM | POA: Diagnosis present

## 2019-10-08 DIAGNOSIS — R443 Hallucinations, unspecified: Secondary | ICD-10-CM | POA: Diagnosis present

## 2019-10-08 DIAGNOSIS — Z7902 Long term (current) use of antithrombotics/antiplatelets: Secondary | ICD-10-CM

## 2019-10-08 DIAGNOSIS — Z841 Family history of disorders of kidney and ureter: Secondary | ICD-10-CM

## 2019-10-08 DIAGNOSIS — F1290 Cannabis use, unspecified, uncomplicated: Secondary | ICD-10-CM | POA: Diagnosis present

## 2019-10-08 DIAGNOSIS — R0789 Other chest pain: Secondary | ICD-10-CM | POA: Diagnosis present

## 2019-10-08 DIAGNOSIS — R079 Chest pain, unspecified: Secondary | ICD-10-CM

## 2019-10-08 DIAGNOSIS — I2510 Atherosclerotic heart disease of native coronary artery without angina pectoris: Secondary | ICD-10-CM | POA: Diagnosis present

## 2019-10-08 DIAGNOSIS — J449 Chronic obstructive pulmonary disease, unspecified: Secondary | ICD-10-CM | POA: Diagnosis present

## 2019-10-08 DIAGNOSIS — R251 Tremor, unspecified: Secondary | ICD-10-CM | POA: Diagnosis present

## 2019-10-08 DIAGNOSIS — K219 Gastro-esophageal reflux disease without esophagitis: Secondary | ICD-10-CM | POA: Diagnosis present

## 2019-10-08 DIAGNOSIS — F17210 Nicotine dependence, cigarettes, uncomplicated: Secondary | ICD-10-CM | POA: Diagnosis present

## 2019-10-08 DIAGNOSIS — R338 Other retention of urine: Secondary | ICD-10-CM | POA: Diagnosis present

## 2019-10-08 LAB — COMPREHENSIVE METABOLIC PANEL
ALT: 16 U/L (ref 0–44)
AST: 24 U/L (ref 15–41)
Albumin: 3.2 g/dL — ABNORMAL LOW (ref 3.5–5.0)
Alkaline Phosphatase: 53 U/L (ref 38–126)
Anion gap: 13 (ref 5–15)
BUN: 14 mg/dL (ref 8–23)
CO2: 19 mmol/L — ABNORMAL LOW (ref 22–32)
Calcium: 8.6 mg/dL — ABNORMAL LOW (ref 8.9–10.3)
Chloride: 111 mmol/L (ref 98–111)
Creatinine, Ser: 0.82 mg/dL (ref 0.61–1.24)
GFR calc Af Amer: >60 mL/min (ref >60)
GFR calc non Af Amer: >60 mL/min (ref >60)
Glucose, Bld: 106 mg/dL — ABNORMAL HIGH (ref 70–99)
Potassium: 3.5 mmol/L (ref 3.5–5.1)
Sodium: 143 mmol/L (ref 135–145)
Total Bilirubin: 0.7 mg/dL (ref 0.3–1.2)
Total Protein: 6.5 g/dL (ref 6.5–8.1)

## 2019-10-08 LAB — RAPID URINE DRUG SCREEN, HOSP PERFORMED
Amphetamines: NOT DETECTED
Barbiturates: NOT DETECTED
Benzodiazepines: POSITIVE — AB
Cocaine: NOT DETECTED
Opiates: NOT DETECTED
Tetrahydrocannabinol: NOT DETECTED

## 2019-10-08 LAB — CBC WITH DIFFERENTIAL/PLATELET
Abs Immature Granulocytes: 0.01 10*3/uL (ref 0.00–0.07)
Basophils Absolute: 0 10*3/uL (ref 0.0–0.1)
Basophils Relative: 0 %
Eosinophils Absolute: 0.1 10*3/uL (ref 0.0–0.5)
Eosinophils Relative: 1 %
HCT: 41.2 % (ref 39.0–52.0)
Hemoglobin: 13.8 g/dL (ref 13.0–17.0)
Immature Granulocytes: 0 %
Lymphocytes Relative: 35 %
Lymphs Abs: 1.6 10*3/uL (ref 0.7–4.0)
MCH: 30.6 pg (ref 26.0–34.0)
MCHC: 33.5 g/dL (ref 30.0–36.0)
MCV: 91.4 fL (ref 80.0–100.0)
Monocytes Absolute: 0.3 10*3/uL (ref 0.1–1.0)
Monocytes Relative: 6 %
Neutro Abs: 2.5 10*3/uL (ref 1.7–7.7)
Neutrophils Relative %: 58 %
Platelets: UNDETERMINED 10*3/uL (ref 150–400)
RBC: 4.51 MIL/uL (ref 4.22–5.81)
RDW: 14.6 % (ref 11.5–15.5)
WBC: 4.4 10*3/uL (ref 4.0–10.5)
nRBC: 0 % (ref 0.0–0.2)

## 2019-10-08 LAB — ETHANOL: Alcohol, Ethyl (B): <10 mg/dL (ref <10)

## 2019-10-08 LAB — ACETAMINOPHEN LEVEL: Acetaminophen (Tylenol), Serum: <10 ug/mL — ABNORMAL LOW (ref 10–30)

## 2019-10-08 LAB — SARS CORONAVIRUS 2 BY RT PCR (HOSPITAL ORDER, PERFORMED IN ~~LOC~~ HOSPITAL LAB): SARS Coronavirus 2: NEGATIVE

## 2019-10-08 LAB — VALPROIC ACID LEVEL: Valproic Acid Lvl: 169 ug/mL — ABNORMAL HIGH (ref 50.0–100.0)

## 2019-10-08 LAB — SALICYLATE LEVEL: Salicylate Lvl: <7 mg/dL — ABNORMAL LOW (ref 7.0–30.0)

## 2019-10-08 MED ORDER — MIDAZOLAM HCL 2 MG/2ML IJ SOLN
INTRAMUSCULAR | Status: AC
  Filled 2019-10-08: qty 2

## 2019-10-08 MED ORDER — MIDAZOLAM HCL 5 MG/5ML IJ SOLN: 4.0000 mg | Freq: Once | INTRAMUSCULAR | Status: DC

## 2019-10-08 MED ORDER — LORAZEPAM 2 MG/ML IJ SOLN
2.0000 mg | Freq: Once | INTRAMUSCULAR | Status: AC
  Administered 2019-10-08: 2 mg via INTRAVENOUS
  Filled 2019-10-08: qty 1

## 2019-10-08 MED ORDER — MIDAZOLAM HCL 2 MG/2ML IJ SOLN
4.0000 mg | Freq: Once | INTRAMUSCULAR | Status: AC
  Administered 2019-10-08: 4 mg via INTRAMUSCULAR
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
## 2019-10-08 NOTE — ED Notes (Signed)
Pt attempting to get out of violent restraints. Pt spitting on security and nursing staff. PA notified, new order to be placed.

## 2019-10-08 NOTE — ED Notes (Signed)
Violent restraints changed to Non-violent soft wrist restraints.
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
## 2019-10-08 NOTE — ED Notes (Signed)
Pt resting and cooperative with staff at this time. RN requested restraints be changed to non-violent. New order to be placed.

## 2019-10-08 NOTE — ED Triage Notes (Signed)
Pt BIB [facility], per EMS pt took an approx 40 pill combination of seroquel and depakote in a suicide attempt. Pt ran through the lobby, became combative and verbally aggressive. Pt returned to triage by [employer].

## 2019-10-09 DIAGNOSIS — Z823 Family history of stroke: Secondary | ICD-10-CM | POA: Diagnosis not present

## 2019-10-09 DIAGNOSIS — T426X5A Adverse effect of other antiepileptic and sedative-hypnotic drugs, initial encounter: Secondary | ICD-10-CM | POA: Diagnosis present

## 2019-10-09 DIAGNOSIS — R443 Hallucinations, unspecified: Secondary | ICD-10-CM | POA: Diagnosis not present

## 2019-10-09 DIAGNOSIS — I2510 Atherosclerotic heart disease of native coronary artery without angina pectoris: Secondary | ICD-10-CM | POA: Diagnosis present

## 2019-10-09 DIAGNOSIS — I252 Old myocardial infarction: Secondary | ICD-10-CM | POA: Diagnosis not present

## 2019-10-09 DIAGNOSIS — T43592A Poisoning by other antipsychotics and neuroleptics, intentional self-harm, initial encounter: Secondary | ICD-10-CM | POA: Diagnosis not present

## 2019-10-09 DIAGNOSIS — Z20822 Contact with and (suspected) exposure to covid-19: Secondary | ICD-10-CM | POA: Diagnosis not present

## 2019-10-09 DIAGNOSIS — R0789 Other chest pain: Secondary | ICD-10-CM | POA: Diagnosis present

## 2019-10-09 DIAGNOSIS — N401 Enlarged prostate with lower urinary tract symptoms: Secondary | ICD-10-CM | POA: Diagnosis present

## 2019-10-09 DIAGNOSIS — Z8673 Personal history of transient ischemic attack (TIA), and cerebral infarction without residual deficits: Secondary | ICD-10-CM | POA: Diagnosis not present

## 2019-10-09 DIAGNOSIS — T50902A Poisoning by unspecified drugs, medicaments and biological substances, intentional self-harm, initial encounter: Secondary | ICD-10-CM

## 2019-10-09 DIAGNOSIS — F17210 Nicotine dependence, cigarettes, uncomplicated: Secondary | ICD-10-CM | POA: Diagnosis not present

## 2019-10-09 DIAGNOSIS — G2401 Drug induced subacute dyskinesia: Secondary | ICD-10-CM | POA: Diagnosis present

## 2019-10-09 DIAGNOSIS — Z915 Personal history of self-harm: Secondary | ICD-10-CM | POA: Diagnosis not present

## 2019-10-09 DIAGNOSIS — R45851 Suicidal ideations: Secondary | ICD-10-CM | POA: Diagnosis not present

## 2019-10-09 DIAGNOSIS — J449 Chronic obstructive pulmonary disease, unspecified: Secondary | ICD-10-CM | POA: Diagnosis not present

## 2019-10-09 DIAGNOSIS — Z781 Physical restraint status: Secondary | ICD-10-CM | POA: Diagnosis not present

## 2019-10-09 DIAGNOSIS — Z7902 Long term (current) use of antithrombotics/antiplatelets: Secondary | ICD-10-CM | POA: Diagnosis not present

## 2019-10-09 DIAGNOSIS — R338 Other retention of urine: Secondary | ICD-10-CM | POA: Diagnosis present

## 2019-10-09 DIAGNOSIS — Z841 Family history of disorders of kidney and ureter: Secondary | ICD-10-CM | POA: Diagnosis not present

## 2019-10-09 DIAGNOSIS — W3400XA Accidental discharge from unspecified firearms or gun, initial encounter: Secondary | ICD-10-CM | POA: Diagnosis not present

## 2019-10-09 DIAGNOSIS — R001 Bradycardia, unspecified: Secondary | ICD-10-CM | POA: Diagnosis present

## 2019-10-09 DIAGNOSIS — T426X1A Poisoning by other antiepileptic and sedative-hypnotic drugs, accidental (unintentional), initial encounter: Secondary | ICD-10-CM

## 2019-10-09 DIAGNOSIS — E876 Hypokalemia: Secondary | ICD-10-CM | POA: Diagnosis present

## 2019-10-09 DIAGNOSIS — F314 Bipolar disorder, current episode depressed, severe, without psychotic features: Secondary | ICD-10-CM | POA: Diagnosis not present

## 2019-10-09 DIAGNOSIS — Z79899 Other long term (current) drug therapy: Secondary | ICD-10-CM | POA: Diagnosis not present

## 2019-10-09 DIAGNOSIS — Z888 Allergy status to other drugs, medicaments and biological substances status: Secondary | ICD-10-CM | POA: Diagnosis not present

## 2019-10-09 HISTORY — DX: Poisoning by other antiepileptic and sedative-hypnotic drugs, accidental (unintentional), initial encounter: T42.6X1A

## 2019-10-09 LAB — BASIC METABOLIC PANEL
Anion gap: 9 (ref 5–15)
BUN: 8 mg/dL (ref 8–23)
CO2: 19 mmol/L — ABNORMAL LOW (ref 22–32)
Calcium: 8.2 mg/dL — ABNORMAL LOW (ref 8.9–10.3)
Chloride: 116 mmol/L — ABNORMAL HIGH (ref 98–111)
Creatinine, Ser: 0.68 mg/dL (ref 0.61–1.24)
GFR calc Af Amer: >60 mL/min (ref >60)
GFR calc non Af Amer: >60 mL/min (ref >60)
Glucose, Bld: 61 mg/dL — ABNORMAL LOW (ref 70–99)
Potassium: 3.5 mmol/L (ref 3.5–5.1)
Sodium: 144 mmol/L (ref 135–145)

## 2019-10-09 LAB — COMPREHENSIVE METABOLIC PANEL
ALT: 14 U/L (ref 0–44)
ALT: 16 U/L (ref 0–44)
AST: 16 U/L (ref 15–41)
AST: 21 U/L (ref 15–41)
Albumin: 3.1 g/dL — ABNORMAL LOW (ref 3.5–5.0)
Albumin: 3 g/dL — ABNORMAL LOW (ref 3.5–5.0)
Alkaline Phosphatase: 49 U/L (ref 38–126)
Alkaline Phosphatase: 54 U/L (ref 38–126)
Anion gap: 8 (ref 5–15)
Anion gap: 8 (ref 5–15)
BUN: 9 mg/dL (ref 8–23)
BUN: 9 mg/dL (ref 8–23)
CO2: 26 mmol/L (ref 22–32)
CO2: 22 mmol/L (ref 22–32)
Calcium: 8.6 mg/dL — ABNORMAL LOW (ref 8.9–10.3)
Calcium: 8.3 mg/dL — ABNORMAL LOW (ref 8.9–10.3)
Chloride: 110 mmol/L (ref 98–111)
Chloride: 113 mmol/L — ABNORMAL HIGH (ref 98–111)
Creatinine, Ser: 0.77 mg/dL (ref 0.61–1.24)
Creatinine, Ser: 0.72 mg/dL (ref 0.61–1.24)
GFR calc Af Amer: >60 mL/min (ref >60)
GFR calc Af Amer: >60 mL/min (ref >60)
GFR calc non Af Amer: >60 mL/min (ref >60)
GFR calc non Af Amer: >60 mL/min (ref >60)
Glucose, Bld: 73 mg/dL (ref 70–99)
Glucose, Bld: 66 mg/dL — ABNORMAL LOW (ref 70–99)
Potassium: 2.9 mmol/L — ABNORMAL LOW (ref 3.5–5.1)
Potassium: 3.6 mmol/L (ref 3.5–5.1)
Sodium: 144 mmol/L (ref 135–145)
Sodium: 143 mmol/L (ref 135–145)
Total Bilirubin: 0.5 mg/dL (ref 0.3–1.2)
Total Bilirubin: 0.6 mg/dL (ref 0.3–1.2)
Total Protein: 6.5 g/dL (ref 6.5–8.1)
Total Protein: 6.3 g/dL — ABNORMAL LOW (ref 6.5–8.1)

## 2019-10-09 LAB — CBC
HCT: 42.9 % (ref 39.0–52.0)
Hemoglobin: 14.3 g/dL (ref 13.0–17.0)
MCH: 31 pg (ref 26.0–34.0)
MCHC: 33.3 g/dL (ref 30.0–36.0)
MCV: 93.1 fL (ref 80.0–100.0)
Platelets: 123 10*3/uL — ABNORMAL LOW (ref 150–400)
RBC: 4.61 MIL/uL (ref 4.22–5.81)
RDW: 14.7 % (ref 11.5–15.5)
WBC: 5.8 10*3/uL (ref 4.0–10.5)
nRBC: 0 % (ref 0.0–0.2)

## 2019-10-09 LAB — VALPROIC ACID LEVEL
Valproic Acid Lvl: 202 ug/mL (ref 50.0–100.0)
Valproic Acid Lvl: 201 ug/mL (ref 50.0–100.0)
Valproic Acid Lvl: 205 ug/mL (ref 50.0–100.0)
Valproic Acid Lvl: 199 ug/mL (ref 50.0–100.0)
Valproic Acid Lvl: 159 ug/mL — ABNORMAL HIGH (ref 50.0–100.0)

## 2019-10-09 LAB — MAGNESIUM: Magnesium: 2 mg/dL (ref 1.7–2.4)

## 2019-10-09 LAB — HIV ANTIBODY (ROUTINE TESTING W REFLEX): HIV Screen 4th Generation wRfx: NONREACTIVE

## 2019-10-09 LAB — CBG MONITORING, ED: Glucose-Capillary: 63 mg/dL — ABNORMAL LOW (ref 70–99)

## 2019-10-09 MED ORDER — CHARCOAL ACTIVATED PO LIQD
50.0000 g | Freq: Once | ORAL | Status: AC
  Administered 2019-10-09: 50 g via ORAL
  Filled 2019-10-09: qty 240

## 2019-10-09 MED ORDER — POTASSIUM CHLORIDE 10 MEQ/100ML IV SOLN
10.0000 meq | INTRAVENOUS | Status: AC
  Administered 2019-10-09 (×6): 10 meq via INTRAVENOUS
  Filled 2019-10-09 (×5): qty 100

## 2019-10-09 MED ORDER — DEXTROSE 50 % IV SOLN
12.5000 g | Freq: Once | INTRAVENOUS | Status: AC
  Administered 2019-10-09: 12.5 g via INTRAVENOUS
  Filled 2019-10-09: qty 50

## 2019-10-09 MED ORDER — ENOXAPARIN SODIUM 40 MG/0.4ML ~~LOC~~ SOLN
40.0000 mg | Freq: Every day | SUBCUTANEOUS | Status: DC
  Administered 2019-10-09 – 2019-10-12 (×4): 40 mg via SUBCUTANEOUS
  Filled 2019-10-09 (×4): qty 0.4

## 2019-10-09 MED ORDER — SODIUM CHLORIDE 0.9 % IV SOLN: INTRAVENOUS | Status: AC

## 2019-10-09 MED ORDER — ACETAMINOPHEN 650 MG RE SUPP: 650.0000 mg | Freq: Four times a day (QID) | RECTAL | Status: DC | PRN

## 2019-10-09 MED ORDER — ACETAMINOPHEN 325 MG PO TABS
650.0000 mg | ORAL_TABLET | Freq: Four times a day (QID) | ORAL | Status: DC | PRN
  Administered 2019-10-11 – 2019-10-12 (×2): 650 mg via ORAL
  Filled 2019-10-09 (×2): qty 2

## 2019-10-09 NOTE — ED Notes (Signed)
Poisin control called this RN to check on pt. PC recommended IVF and PO charcoal. RN paged Dr. Nino Parsley to notify.

## 2019-10-09 NOTE — ED Notes (Addendum)
Pt only tolerated 10 mL of charcoal. RN will attempt to administer more later.
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
## 2019-10-09 NOTE — ED Notes (Signed)
Pt continuing to rest comfortably at this time.

## 2019-10-09 NOTE — ED Notes (Addendum)
Patient has made multiple attempts to urinate but reports he is unable. Patient states he does not want to be catheterized, will continue to attempt to use urinal.

## 2019-10-09 NOTE — ED Notes (Signed)
Pt given sprite 

## 2019-10-09 NOTE — Progress Notes (Addendum)
° °  Subjective:   Patient admitted overnight for suicide attempt by ingesting Depakote and Seroquel. Was aggressive toward staff so was sedated by Versed and Ativan, and restrained. Patient sleeping upon examination this morning.  Objective:  Vital signs in last 24 hours: Vitals:   10/09/19 0345 10/09/19 0430 10/09/19 0530 10/09/19 0600  BP: (!) 161/97 (!) 150/97 125/79 137/90  Pulse: 66 77 67 78  Resp: 14 (!) 21 20 (!) 22  Temp:      TempSrc:      SpO2: 98% 95% 96% 97%    Physical Exam Constitutional: no acute distress Head: atraumatic ENT: external ears normal Pulmonary: effort normal Neurological: sleeping peacefully Psychiatric: unable to assess at this time  Assessment/Plan: Samuel Morrison is a 69 y.o. male with hx of CVA, MI, COPD, bipolar disorder presenting with suicide attempt by overdose with depakote and seroquel.   Active Problems:   Suicidal overdose (HCC)  Intentional medication overdose - Depakote and Seoquel Reportedly took "handful" of his home meds. History of suicide attempts by the same mechanism. EKG since admission with normal QT intervals. Some PACs. Negative salicylate, tylenol, ethanol. Small amount of charcoal administered. ED spoke with poison control, who recommended monitoring Depakote levels. 169 -> 202 -> 201 -monitor QTc and heart rate -repeat valproate levels q4hr   Aggressive behavior Versed and Ativan administered by ED, as well as restraints. -continue physical and chemical restraints per ED  Suicidal ideation History of the same with prior ED visits. -consult psych when medically stable   Diet: NPO IVF:  VTE: Lovenox Prior to Admission Living Arrangement: home Anticipated Discharge Location: inpatient psychiatry Barriers to Discharge: suicidal ideation Dispo: Anticipated discharge in approximately 1-2 day(s).   Remo Lipps, MD 10/09/2019, 6:43 AM Pager: [phone] After 5pm on weekdays and 1pm on weekends: On Call  pager [phone]

## 2019-10-09 NOTE — ED Notes (Signed)
Admitting paged to notify of pt potassium of 2.9

## 2019-10-09 NOTE — Hospital Course (Addendum)
1.Suicide attempt - Presented with overdose of Depakote and Seroquel. Received Versed and Ativan and placed on restrain/IVC in ED due to aggression, abusive behavior.  Gave a small amount of activated charcoal. Per poison control, we trended valproate levels until he reacher the therapeutic range. No issues with QT interval. Noted to be bradycardic initially, improved to 60s-70s. Urinary retention was noted in the ED and relieved with I&O cath. Home prazosin resumed and voiding difficulty resolved. Had nausea which has improved spontaneously, tolerating diet. Mild hypokalemia repleted. Tardive dyskinesia was noted, patients reports years-long history of the same. Had episodes of atypical chest, see below. Patient was medically cleared, but he continued to state that he "wanted to die." Started back on his home medications at lower dose: Seroquel XR 200mg  daily, Valproate 500mg  BID.   2. Atypical chest pain - Hx of CAD. Patient complained of L-sided chest pain several times during admission. He reports this is similar to pain that he has had before. Multiple EKGs without ischemic changes, troponin negative x3, CXR negative.   3. BPH - Patient developed voiding difficulty on second day of admission. Anti-cholinergic overdose vs BPH. Relieved with I&O catheter. Symptoms improved after resuming prazosin.

## 2019-10-09 NOTE — Discharge Summary (Addendum)
Name: Samuel Morrison MRN: [id number] DOB: 1950-04-22 69 y.o. PCP: Patient, No Pcp Per  Date of Admission: 10/08/2019  5:45 PM Date of Discharge: 10/11/2019 Attending Physician: Anne Shutter, MD  Discharge Diagnosis: 1. Intentional overdose (Depakote, Seroquel) 2.  COPD  Discharge Medications: Allergies as of 10/11/2019       Reactions   Lisinopril Swelling        Medication List     STOP taking these medications    divalproex 500 MG 24 hr tablet Commonly known as: DEPAKOTE ER   QUEtiapine 200 MG 24 hr tablet Commonly known as: SEROQUEL XR   QUEtiapine 400 MG 24 hr tablet Commonly known as: SEROQUEL XR       TAKE these medications    atorvastatin 80 MG tablet Commonly known as: LIPITOR Take 1 tablet (80 mg total) by mouth daily.   clopidogrel 75 MG tablet Commonly known as: PLAVIX Take 1 tablet (75 mg total) by mouth daily.   diltiazem 180 MG 24 hr capsule Commonly known as: CARDIZEM CD Take 1 capsule (180 mg total) by mouth daily.   omeprazole 40 MG capsule Commonly known as: PRILOSEC Take 1 capsule (40 mg total) by mouth daily.   prazosin 1 MG capsule Commonly known as: MINIPRESS Take 1 capsule (1 mg total) by mouth at bedtime.        Disposition and follow-up:   [name] was discharged from [facility] in Stable condition.  At the hospital follow up visit please address:  1.  Follow up      A. Suicidal ideation - Reporting still that he "wants to die" at time of discharge.    2.  Labs / imaging needed at time of follow-up: none  3.  Pending labs/ test needing follow-up: none  Follow-up Appointments:    Hospital Course by problem list: 1. Suicide attempt - Presented with overdose of Depakote and Seroquel. Received Versed and Ativan and placed on restraints/IVC in ED due to aggression, abusive behavior.  Gave a small amount of activated charcoal.  ED physician spoke to poison control and recommended  observing overnight for serial Depakote level, (expecting to trend up, then down) monitoring QTC and heart rate. Depakote trended up to 200s, leveled off, then gradually decreased over the next day to within the reference range. Per poison control, no need for further monitoring of level. Urinary retention was noted in the ED and relieved with I&O catheterization, patient started voiding spontaneously on 7/8. Noted some nausea, but accepting PO food and medications. EKGs without QT prolongation, though bradycardia and occasional PACs noted. Patient had mild hypokalemia which was repleted. Tardive dyskinesia was noted, patients reports years-long history of the same. Patient was medically cleared, but he continued to state that he "wanted to die." Psych was consulted for behavioral health transfer.  Discharge Vitals:   BP (!) 154/89 (BP Location: Left Arm)    Pulse 63    Temp 98.5 F (36.9 C) (Oral)    Resp 18    Ht 5\' 9"  (1.753 m)    Wt 70.9 kg    SpO2 100%    BMI 23.08 kg/m   Pertinent Labs, Studies, and Procedures:   Recent Labs    10/08/19 2300 10/09/19 0329 10/09/19 1333 10/09/19 1707 10/09/19 2020 10/09/19 2330 10/10/19 0440 10/10/19 0716 10/10/19 1521 10/10/19 2128  VALPROATE 202* 201* 205* 199* 159* 191* 146* 140* 109* 78   BMP Latest Ref Rng & Units 10/11/2019 10/10/2019 10/10/2019  Glucose 70 - 99 mg/dL 076(K) 088(P) 10(R)  BUN 8 - 23 mg/dL 11 9 8   Creatinine 0.61 - 1.24 mg/dL 1.59 4.58  Sodium 135 - 145 mmol/L 140 141 143  Potassium 3.5 - 5.1 mmol/L 3.3(L) 3.6 3.2(L)  Chloride 98 - 111 mmol/L 109 111 112(H)  CO2 22 - 32 mmol/L 24 21(L) 22  Calcium 8.9 - 10.3 mg/dL 5.92) 9.2(K) 8.3(L)     Discharge Instructions: Discharge Instructions     Call MD for:  extreme fatigue   Complete by: As directed    Call MD for:  persistant dizziness or light-headedness   Complete by: As directed    Diet - low sodium heart healthy   Complete by: As directed    Discharge instructions    Complete by: As directed    [name], it was a pleasure taking care of you. We are transferring you to our behavioral health colleagues. I hope they can provide the help that you need.  We are holding your Seroquel and Depakote, and will defer to behavioral health on your psychiatric medications.       Signed: Roxan Hockey, MD 10/11/2019, 1:08 PM   Pager: [phone]

## 2019-10-09 NOTE — ED Notes (Signed)
Sophia, PA notified of pt valproic acid result.

## 2019-10-09 NOTE — ED Notes (Signed)
Poison control contacted this RN regarding pt's current status. Stated they will follow up in the morning to reassess

## 2019-10-09 NOTE — ED Notes (Signed)
Pt resting comfortably at this time. Pt has continued to be cooperative with staff and non threatening. Pt no longer trying to pull at lines and interfering with care. Pt verbalized his understanding of the requirements for restraints to be discontinued.  Pt non-violent restraints removed by RN due to pt cooperating and following instructions. Pt belongings at nurses station in bags with pt labels, and pt in room near nurses station.

## 2019-10-10 ENCOUNTER — Inpatient Hospital Stay (HOSPITAL_COMMUNITY): Admit: 2019-10-10 | Payer: Self-pay | Admitting: Psychiatry

## 2019-10-10 LAB — VALPROIC ACID LEVEL
Valproic Acid Lvl: 146 ug/mL — ABNORMAL HIGH (ref 50.0–100.0)
Valproic Acid Lvl: 140 ug/mL — ABNORMAL HIGH (ref 50.0–100.0)
Valproic Acid Lvl: 191 ug/mL (ref 50.0–100.0)
Valproic Acid Lvl: 109 ug/mL — ABNORMAL HIGH (ref 50.0–100.0)
Valproic Acid Lvl: 78 ug/mL (ref 50.0–100.0)

## 2019-10-10 LAB — CBC
HCT: 41 % (ref 39.0–52.0)
Hemoglobin: 13.9 g/dL (ref 13.0–17.0)
MCH: 30.5 pg (ref 26.0–34.0)
MCHC: 33.9 g/dL (ref 30.0–36.0)
MCV: 90.1 fL (ref 80.0–100.0)
Platelets: 121 10*3/uL — ABNORMAL LOW (ref 150–400)
RBC: 4.55 MIL/uL (ref 4.22–5.81)
RDW: 14.3 % (ref 11.5–15.5)
WBC: 4.2 10*3/uL (ref 4.0–10.5)
nRBC: 0 % (ref 0.0–0.2)

## 2019-10-10 LAB — BASIC METABOLIC PANEL
Anion gap: 9 (ref 5–15)
Anion gap: 9 (ref 5–15)
BUN: 8 mg/dL (ref 8–23)
BUN: 9 mg/dL (ref 8–23)
CO2: 22 mmol/L (ref 22–32)
CO2: 21 mmol/L — ABNORMAL LOW (ref 22–32)
Calcium: 8.3 mg/dL — ABNORMAL LOW (ref 8.9–10.3)
Calcium: 8.5 mg/dL — ABNORMAL LOW (ref 8.9–10.3)
Chloride: 112 mmol/L — ABNORMAL HIGH (ref 98–111)
Chloride: 111 mmol/L (ref 98–111)
Creatinine, Ser: 0.71 mg/dL (ref 0.61–1.24)
Creatinine, Ser: 0.89 mg/dL (ref 0.61–1.24)
GFR calc Af Amer: >60 mL/min (ref >60)
GFR calc Af Amer: >60 mL/min (ref >60)
GFR calc non Af Amer: >60 mL/min (ref >60)
GFR calc non Af Amer: >60 mL/min (ref >60)
Glucose, Bld: 67 mg/dL — ABNORMAL LOW (ref 70–99)
Glucose, Bld: 143 mg/dL — ABNORMAL HIGH (ref 70–99)
Potassium: 3.2 mmol/L — ABNORMAL LOW (ref 3.5–5.1)
Potassium: 3.6 mmol/L (ref 3.5–5.1)
Sodium: 143 mmol/L (ref 135–145)
Sodium: 141 mmol/L (ref 135–145)

## 2019-10-10 LAB — MAGNESIUM: Magnesium: 2 mg/dL (ref 1.7–2.4)

## 2019-10-10 LAB — CBG MONITORING, ED
Glucose-Capillary: 68 mg/dL — ABNORMAL LOW (ref 70–99)
Glucose-Capillary: 89 mg/dL (ref 70–99)

## 2019-10-10 LAB — GLUCOSE, CAPILLARY: Glucose-Capillary: 78 mg/dL (ref 70–99)

## 2019-10-10 MED ORDER — PANTOPRAZOLE SODIUM 40 MG PO TBEC
40.0000 mg | DELAYED_RELEASE_TABLET | Freq: Every day | ORAL | Status: DC
  Administered 2019-10-10 – 2019-10-15 (×6): 40 mg via ORAL
  Filled 2019-10-10 (×6): qty 1

## 2019-10-10 MED ORDER — POTASSIUM CHLORIDE 20 MEQ PO PACK
40.0000 meq | PACK | Freq: Once | ORAL | Status: AC
  Administered 2019-10-10: 40 meq via ORAL
  Filled 2019-10-10: qty 2

## 2019-10-10 MED ORDER — DEXTROSE IN LACTATED RINGERS 5 % IV SOLN: INTRAVENOUS | Status: AC

## 2019-10-10 MED ORDER — SODIUM CHLORIDE 0.9 % IV SOLN: INTRAVENOUS | Status: DC

## 2019-10-10 MED ORDER — POTASSIUM CHLORIDE 10 MEQ/100ML IV SOLN
10.0000 meq | INTRAVENOUS | Status: AC
  Administered 2019-10-10 (×3): 10 meq via INTRAVENOUS
  Filled 2019-10-10 (×3): qty 100

## 2019-10-10 MED ORDER — CLOPIDOGREL BISULFATE 75 MG PO TABS
75.0000 mg | ORAL_TABLET | Freq: Every day | ORAL | Status: DC
  Administered 2019-10-10 – 2019-10-15 (×6): 75 mg via ORAL
  Filled 2019-10-10 (×6): qty 1

## 2019-10-10 MED ORDER — ONDANSETRON HCL 4 MG/2ML IJ SOLN
4.0000 mg | Freq: Once | INTRAMUSCULAR | Status: AC
  Administered 2019-10-10: 4 mg via INTRAVENOUS
  Filled 2019-10-10: qty 2

## 2019-10-10 MED ORDER — ATORVASTATIN CALCIUM 80 MG PO TABS
80.0000 mg | ORAL_TABLET | Freq: Every day | ORAL | Status: DC
  Administered 2019-10-10 – 2019-10-15 (×6): 80 mg via ORAL
  Filled 2019-10-10 (×6): qty 1

## 2019-10-10 NOTE — BH Assessment (Addendum)
Sent nursing staff patient's acceptance information for his admission to  [facility].   Clinician was informed that patient was going to the medical floor.   TTS staff informed staff that patients bed would not be held at [facility]. Patient's bed assignment is terminated until he is medically cleared and a psychiatric provider recommends inpatient treatment for him.

## 2019-10-10 NOTE — ED Notes (Signed)
Pt up to the br to have a bm he respnded gong back to bed that he wished that he was dead
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
## 2019-10-10 NOTE — Progress Notes (Signed)
New Admission Note:  Arrival Method: arrived via [facility] ED stretcher Mental Orientation: alert and oriented  Telemetry: yes, sinus brady showing on the monitor Assessment: Completed Skin: clean and dry IV: Rt forearm Pain: no complaints of pain or discomfort Safety Measures: Safety Fall Prevention Plan was given, discussed. Admission: Completed 3W: Patient has been orientated to the room, unit and the staff. Family: no family at bedside. Pt has suicide sitter at bedside.  Orders have been reviewed and implemented. Will continue to monitor the patient. Call light has been placed within reach and bed alarm has been activated.   Kathrynn Humble ,RN

## 2019-10-10 NOTE — BH Assessment (Signed)
Patient was accepted to [facility] after 3pm. The accepting provider is Dr. Lucianne Muss and the attending provider is Dr. Jola Babinski. The room number is 507-1. Please call report to #[phone].

## 2019-10-10 NOTE — ED Notes (Signed)
Poison Control called & this RN updated them on pt status & they recommended that valproic acid checked as we are doing, at least until it's WNL. Other than that, they are pleased with his report.

## 2019-10-10 NOTE — ED Notes (Signed)
Pt was able to void & have a BM in the bathroom with his sitter there for safety.

## 2019-10-10 NOTE — ED Notes (Signed)
Sitter at  The bedside

## 2019-10-10 NOTE — ED Notes (Signed)
Orange juice given to pt once informed of pt's CBG, will recheck CBG again for follow up.

## 2019-10-10 NOTE — Progress Notes (Addendum)
   Subjective:   Patient evaluated at bedside. He complains of nausea, abdominal discomfort, and leg cramping. Also having tardive-like movements, which he states has been present for years. Continues stating that he wants to die. Denies CP or SOB.  Objective:  Vital signs in last 24 hours: Vitals:   10/10/19 0445 10/10/19 0500 10/10/19 0515 10/10/19 0600  BP: (!) 149/91 (!) 142/84 (!) 146/90 (!) 133/94  Pulse: 67  61 68  Resp:      Temp:      TempSrc:      SpO2: 100%  98% 96%    Physical Exam Constitutional: no acute distress Head: atraumatic ENT: external ears normal Eyes: EOMI Cardiovascular: bradycardic with occasional PACs, normal heart sounds Pulmonary: effort normal, lungs clear to ascultation bilaterally Abdominal: flat, nontender, no rebound tenderness, bowel sounds normal Skin: warm and dry Neurological: alert, small movements of extremities consistent with tardive dyskinesia Psychiatric: depressed  Assessment/Plan: Samuel Morrison is a 69 y.o. male with hx of CVA, MI, COPD, bipolar disorder presenting with suicide attempt by overdose with depakote and seroquel.   Principal Problem:   Suicidal overdose (HCC) Active Problems:   Bipolar 1 disorder, depressed, severe (HCC)   Valproic acid toxicity  Intentional medication overdose - Depakote and Seoquel Reportedly took "handful" of his home meds. History of suicide attempts by the same mechanism. EKG since admission with normal QT intervals. Some PACs. Negative salicylate, tylenol, ethanol. Small amount of charcoal administered. ED spoke with poison control, who recommended monitoring Depakote levels--down to 140 this morning. Per poison control, we can clear him medically when his levels are in the 100s. He has been bradycardic occasionally with some PACs. -monitor QTc and heart rate -repeat valproate levels q6hr  Suicidal ideation History of the same with prior ED visits. Continues to state that he wants to  die. -consult psych when medically stable   Aggressive behavior - resolved Versed and Ativan administered by ED, as well as restraints. Upon examination this morning, is calm but remains depressed.  Hx stroke -start home Plavix -start home Lipitor  Hx MI -start home Plavix -start home Lipitor  Diet: regular IVF:  VTE: Lovenox Prior to Admission Living Arrangement: home Anticipated Discharge Location: inpatient behavioral health Barriers to Discharge: suicidal ideation Dispo: Anticipated discharge in approximately 1-2 day(s).   Remo Lipps, MD 10/10/2019, 7:00 AM Pager: [phone] After 5pm on weekdays and 1pm on weekends: On Call pager [phone]

## 2019-10-11 LAB — GLUCOSE, CAPILLARY
Glucose-Capillary: 102 mg/dL — ABNORMAL HIGH (ref 70–99)
Glucose-Capillary: 106 mg/dL — ABNORMAL HIGH (ref 70–99)

## 2019-10-11 LAB — BASIC METABOLIC PANEL
Anion gap: 7 (ref 5–15)
BUN: 11 mg/dL (ref 8–23)
CO2: 24 mmol/L (ref 22–32)
Calcium: 8.6 mg/dL — ABNORMAL LOW (ref 8.9–10.3)
Chloride: 109 mmol/L (ref 98–111)
Creatinine, Ser: 0.85 mg/dL (ref 0.61–1.24)
GFR calc Af Amer: >60 mL/min (ref >60)
GFR calc non Af Amer: >60 mL/min (ref >60)
Glucose, Bld: 112 mg/dL — ABNORMAL HIGH (ref 70–99)
Potassium: 3.3 mmol/L — ABNORMAL LOW (ref 3.5–5.1)
Sodium: 140 mmol/L (ref 135–145)

## 2019-10-11 LAB — TROPONIN I (HIGH SENSITIVITY): Troponin I (High Sensitivity): 5 ng/L (ref <18)

## 2019-10-11 MED ORDER — ALUM & MAG HYDROXIDE-SIMETH 200-200-20 MG/5ML PO SUSP
15.0000 mL | Freq: Once | ORAL | Status: AC | PRN
  Administered 2019-10-13: 15 mL via ORAL
  Filled 2019-10-11 (×2): qty 30

## 2019-10-11 MED ORDER — FAMOTIDINE 20 MG PO TABS
20.0000 mg | ORAL_TABLET | Freq: Once | ORAL | Status: AC
  Administered 2019-10-11: 20 mg via ORAL
  Filled 2019-10-11: qty 1

## 2019-10-11 MED ORDER — POTASSIUM CHLORIDE CRYS ER 20 MEQ PO TBCR
40.0000 meq | EXTENDED_RELEASE_TABLET | Freq: Two times a day (BID) | ORAL | Status: AC
  Administered 2019-10-11: 40 meq via ORAL
  Filled 2019-10-11: qty 2

## 2019-10-11 MED ORDER — POTASSIUM CHLORIDE 20 MEQ PO PACK
40.0000 meq | PACK | Freq: Two times a day (BID) | ORAL | Status: DC
  Filled 2019-10-11: qty 2

## 2019-10-11 NOTE — Progress Notes (Signed)
MD Tonye Royalty called back. EKG obtained. Said they would be by shortly to see patient. Samuel Morrison

## 2019-10-11 NOTE — Progress Notes (Addendum)
° ° ° °  Subjective:   Patient is at bedside during examination. Patient complains of chest pain which started when he was resting. Rate 8/10. Now 3/10 without intervention. EKG normal. He does have burning pain sensation at the chest which he takes antacid at home. 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 Patient also has baseline shortness of breath which he attributes to COPD. He also had continued abdominal pain and nausea, similar to yesterday.  Objective:  Vital signs in last 24 hours: Vitals:   10/11/19 0010 10/11/19 0341 10/11/19 0807 10/11/19 1020  BP: (!) 155/93 (!) 158/100 (!) 144/102 (!) 177/103  Pulse: (!) 53 63 62 68  Resp:  18 18   Temp:  98 F (36.7 C) 98.1 F (36.7 C)   TempSrc:  Oral Oral   SpO2: 94% 100% 98% 98%  Weight:  70.9 kg    Height:       High sensitivity trop: 5 EKG: sinus arrhythmia, no ischemic changes  Physical Exam Constitutional: no acute distress Head: atraumatic ENT: external ears normal Eyes: EOMI Cardiovascular: regular rate and rhythm, normal heart sounds Pulmonary: effort normal, lungs clear to ascultation bilaterally Abdominal: flat, nontender, no rebound tenderness, bowel sounds normal Skin: warm and dry Neurological: alert, no focal deficit, persistent movements of extremities consistent with tardive dyskinesia Psychiatric: depressed mood and flat affect  Assessment/Plan: Samuel Morrison is a 69 y.o. male with hx of CVA, MI, COPD, bipolar disorder presenting with suicide attempt by overdose with depakote and seroquel.   Principal Problem:   Intentional overdose of drug in tablet form (HCC) Active Problems:   Bipolar 1 disorder, depressed, severe (HCC)   Valproic acid toxicity  Suicidal ideation History of the same with prior ED visits. Continues to state that he wants to die. -consult psych when medically stable  Chest pain - resolved Patient reported CP 8/10 this morning that decreased to  3/10 without intervention. Occurred while at rest. Reports similar to prior CP that he has at home. EKG unremarkable. High sensitivity troponin negative at 5. -no further workup -Gave one-time dose of H2 blocker to help with GERD symptoms  Intentional medication overdose of Depakote and Seroquel - resolved Valproate evels now in normal therapeutic range. Reportedly took "handful" of his home meds. History of suicide attempts by the same mechanism. EKG since admission with normal QT intervals. Some PACs. Negative salicylate, tylenol, ethanol. Small amount of charcoal administered. ED spoke with poison control, who recommended monitoring Depakote levels and medical clearance when his levels are in the 100s. Had some bradycardia earlier yesterday which has improved. -monitor QTc and heart rate   Aggressive behavior - resolved Versed and Ativan administered by ED, as well as restraints. Upon examination this morning, is calm but remains depressed.   Hx stroke -continue home Plavix -continue home Lipitor   Hx MI -continue home Plavix -continue home Lipitor  GERD Experiencing  -continue Protonics   Diet: regular IVF:  VTE: Lovenox Prior to Admission Living Arrangement: home Anticipated Discharge Location: inpatient behavioral health Barriers to Discharge: suicidal ideation Dispo: Anticipated transfer to [facility] today  Remo Lipps, MD 10/11/2019, 11:49 AM Pager: [phone] After 5pm on weekdays and 1pm on weekends: On Call pager [phone]

## 2019-10-11 NOTE — Progress Notes (Signed)
Pt c/o chest pain of 8, said it's pressure and not radiating. 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 RN asked pt his tremors was normal and pt said it's worse now comparing to his baseline. MD on call paged and notified. No orders received yet. Will continue to closely monitor pt. Dionne Bucy RN

## 2019-10-11 NOTE — Progress Notes (Signed)
CSW acknowledging medical stability to transition to [facility]. CSW contacted [facility] to ask whether bed was still available for patient. They will review.  Blenda Nicely, [place] Clinical Social Worker [phone]
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
## 2019-10-11 NOTE — Progress Notes (Signed)
Sitter called nurse into room d/t patient c/o chest pain in center of chest like a stabbing pain does not radiate. Skin warm and dry. Rates CP and 8. Primary nurse notified. Primary nurse paged the MD.

## 2019-10-11 NOTE — Plan of Care (Signed)
  Problem: Education: Goal: Knowledge of General Education information will improve Description: Including pain rating scale, medication(s)/side effects and non-pharmacologic comfort measures Outcome: Progressing   Problem: Health Behavior/Discharge Planning: Goal: Ability to manage health-related needs will improve Outcome: Progressing   Problem: Clinical Measurements: Goal: Ability to maintain clinical measurements within normal limits will improve Outcome: Progressing Goal: Will remain free from infection Outcome: Progressing Goal: Diagnostic test results will improve Outcome: Progressing Goal: Respiratory complications will improve Outcome: Progressing Goal: Cardiovascular complication will be avoided Outcome: Progressing   Problem: Activity: Goal: Risk for activity intolerance will decrease Outcome: Progressing   Problem: Nutrition: Goal: Adequate nutrition will be maintained Outcome: Progressing   Problem: Coping: Goal: Level of anxiety will decrease Outcome: Progressing   Problem: Elimination: Goal: Will not experience complications related to bowel motility Outcome: Progressing Goal: Will not experience complications related to urinary retention Outcome: Progressing   Problem: Pain Managment: Goal: General experience of comfort will improve Outcome: Progressing   Problem: Safety: Goal: Ability to remain free from injury will improve Outcome: Progressing   Problem: Skin Integrity: Goal: Risk for impaired skin integrity will decrease Outcome: Progressing   Problem: Education: Goal: Knowledge of warning signs, risks, and behaviors that relate to suicide ideation and self-harm behaviors will improve Outcome: Progressing   Problem: Health Behavior/Discharge (Transition) Planning: Goal: Ability to manage health-related needs will improve Outcome: Progressing   Problem: Clinical Measurements: Goal: Remain free from any harm during hospitalization Outcome:  Progressing   Problem: Nutrition: Goal: Adequate fluids and nutrition will be maintained Outcome: Progressing   Problem: Coping: Goal: Ability to disclose and discuss thoughts of suicide and self-harm will improve Outcome: Progressing   Problem: Medication Management: Goal: Adhere to prescribed medication regimen Outcome: Progressing   Problem: Sleep Hygiene: Goal: Ability to obtain adequate restful sleep will improve Outcome: Progressing   Problem: Self Esteem: Goal: Ability to verbalize positive feeling about self will improve Outcome: Progressing   

## 2019-10-12 ENCOUNTER — Inpatient Hospital Stay (HOSPITAL_COMMUNITY): Payer: Medicare HMO

## 2019-10-12 LAB — CBC
HCT: 42.3 % (ref 39.0–52.0)
Hemoglobin: 14.9 g/dL (ref 13.0–17.0)
MCH: 30.8 pg (ref 26.0–34.0)
MCHC: 35.2 g/dL (ref 30.0–36.0)
MCV: 87.6 fL (ref 80.0–100.0)
Platelets: 113 10*3/uL — ABNORMAL LOW (ref 150–400)
RBC: 4.83 MIL/uL (ref 4.22–5.81)
RDW: 13.7 % (ref 11.5–15.5)
WBC: 6.2 10*3/uL (ref 4.0–10.5)
nRBC: 0 % (ref 0.0–0.2)

## 2019-10-12 LAB — GLUCOSE, CAPILLARY
Glucose-Capillary: 100 mg/dL — ABNORMAL HIGH (ref 70–99)
Glucose-Capillary: 106 mg/dL — ABNORMAL HIGH (ref 70–99)
Glucose-Capillary: 126 mg/dL — ABNORMAL HIGH (ref 70–99)
Glucose-Capillary: 84 mg/dL (ref 70–99)

## 2019-10-12 LAB — TROPONIN I (HIGH SENSITIVITY)
Troponin I (High Sensitivity): 5 ng/L (ref <18)
Troponin I (High Sensitivity): 4 ng/L (ref <18)

## 2019-10-12 MED ORDER — ALUM & MAG HYDROXIDE-SIMETH 200-200-20 MG/5ML PO SUSP
30.0000 mL | Freq: Once | ORAL | Status: AC
  Filled 2019-10-12: qty 30

## 2019-10-12 MED ORDER — LIDOCAINE VISCOUS HCL 2 % MT SOLN
15.0000 mL | Freq: Once | OROMUCOSAL | Status: AC
  Administered 2019-10-12: 15 mL via ORAL
  Filled 2019-10-12: qty 15

## 2019-10-12 MED ORDER — LORAZEPAM 2 MG/ML IJ SOLN: 2.0000 mg | Freq: Once | INTRAMUSCULAR | Status: AC

## 2019-10-12 MED ORDER — HYDRALAZINE HCL 20 MG/ML IJ SOLN
INTRAMUSCULAR | Status: AC
  Filled 2019-10-12: qty 1

## 2019-10-12 MED ORDER — HYDRALAZINE HCL 20 MG/ML IJ SOLN
10.0000 mg | Freq: Once | INTRAMUSCULAR | Status: AC
  Administered 2019-10-12: 10 mg via INTRAVENOUS

## 2019-10-12 MED ORDER — DILTIAZEM HCL ER COATED BEADS 120 MG PO CP24
120.0000 mg | ORAL_CAPSULE | Freq: Every day | ORAL | Status: DC
  Administered 2019-10-12 – 2019-10-15 (×4): 120 mg via ORAL
  Filled 2019-10-12 (×4): qty 1

## 2019-10-12 MED ORDER — ALUM & MAG HYDROXIDE-SIMETH 200-200-20 MG/5ML PO SUSP
30.0000 mL | Freq: Once | ORAL | Status: AC
  Administered 2019-10-12: 30 mL via ORAL

## 2019-10-12 MED ORDER — NITROGLYCERIN 0.4 MG SL SUBL
SUBLINGUAL_TABLET | SUBLINGUAL | Status: AC
  Administered 2019-10-12 (×3): 0.4 mg
  Filled 2019-10-12: qty 3

## 2019-10-12 MED ORDER — LORAZEPAM 2 MG/ML IJ SOLN
INTRAMUSCULAR | Status: AC
  Administered 2019-10-12: 2 mg via INTRAVENOUS
  Filled 2019-10-12: qty 1

## 2019-10-12 MED ORDER — DILTIAZEM HCL-DEXTROSE 125-5 MG/125ML-% IV SOLN (PREMIX)
5.0000 mg/h | INTRAVENOUS | Status: DC
  Filled 2019-10-12: qty 125

## 2019-10-12 MED ORDER — LORAZEPAM 1 MG PO TABS
1.0000 mg | ORAL_TABLET | Freq: Once | ORAL | Status: AC
  Administered 2019-10-12: 1 mg via ORAL
  Filled 2019-10-12: qty 1

## 2019-10-12 NOTE — Progress Notes (Signed)
CSW received a call from Alfredo Bach [facility] CM/SW regarding pt disposition. She reports that she contacted [facility] and there IS NOT an available bed for pt. The disposition continues to be: gero-psych placement.    CSW faxed referral to the following gero-psych facilities for review:  [facility]  Trula Slade, MSW, [place] Clinical Social Worker 10/12/2019 12:31 PM

## 2019-10-12 NOTE — Progress Notes (Signed)
Subjective: HD#3   Overnight: Mr. Treinen had an episode of chest pain overnight which he rated at 8 out of 10, achy, midsternal nonradiating.  He states that it was similar to his prior episodes the morning when the team rounded on him.  He did not report of nausea, vomiting or shortness of breath.  When the night team evaluated him he was tremulous, not diaphoretic.  Vital signs reported in the room was in the 230s/200s bilaterally with heart rate in the 190s however BP on the telemetry monitor was reported with range 130s-170s/70s-100s.  He received hydralazine as needed, Ativan, sublingual nitrogen with resolution of his symptoms and no further reported chest pain.  Several EKGs were obtained which did not reveal any signs of ACS.  Initial EKG was unremarkable, repeated EKG showed sinus bradycardia.  Troponin was obtained and was also unremarkable.  Chest x-ray does not show any acute cardiopulmonary insults.  Today, Sukhraj Esquivias was examined at bedside and reports that he is feeling much better.  He denies chest pain, shortness of breath, nausea vomiting.  He does have chronic bilateral upper extremity tremors.  Objective:  Vital signs in last 24 hours: Vitals:   10/12/19 0024 10/12/19 0206 10/12/19 0404 10/12/19 0752  BP: (!) 174/71 (!) 133/92 117/62 132/80  Pulse: (!) 56 85 76 (!) 59  Resp: 18 20  18   Temp: 98.6 F (37 C) 98.5 F (36.9 C) 98.2 F (36.8 C) 98.3 F (36.8 C)  TempSrc: Oral Oral Oral Oral  SpO2: 96% 100% 99% 100%  Weight:      Height:       Const: In no apparent distress, lying comfortably in bed, conversational HEENT: Atraumatic, normocephalic CV: RRR, no murmurs, gallop, rub   Assessment/Plan:  Principal Problem:   Intentional overdose of drug in tablet form (HCC) Active Problems:   Bipolar 1 disorder, depressed, severe (HCC)   Valproic acid toxicity  Waymond Meador is a 69 y.o. male with hx of CVA, MI, COPD, bipolar disorderpresenting  withsuicide attempt by overdose with depakote and seroquel.  Suicidal ideation History of the same with prior ED visits.Continues to state that he wants to die. -Pending placement to behavioral health center  Atypical chest pain -ACS ruled out Did report another episode of chest pain overnight.  Discrepancy in blood pressure readings.  ACS ruled out.  Chest x-ray did not show widened mediastinum.  Troponin and EKG unremarkable.  Chest pain resolved with Ativan, as needed hydralazine and nitroglycerin most likely secondary to anxiety versus acid reflux.  Can consider CTA of chest to rule out dissection if chest pain recurs -Continue Protonix, Maalox -Continue to monitor   Intentional medication overdose of Depakote and Seroquel - resolved Valproate levels now in normal therapeutic range. Reportedly took "handful" of his home meds. History of suicide attempts by the same mechanism. EKG since admission with normal QT intervals. Some PACs. Negative salicylate, tylenol, ethanol. Small amount of charcoal administered. ED spoke with poison control, who recommended monitoring Depakote levels and medical clearance when his levels are in the 100s. Had some bradycardia earlier yesterday which has improved. -Monitor QTc and heart rate  Aggressive behavior- resolved Versed and Ativan administered by ED, as well as restraints.Upon examination this morning, is calm but remains depressed.  Hypertension -Resume home diltiazem  Hx stroke -Continue home Plavix -Continue home Lipitor  Hx MI -Continue home Plavix -Continue home Lipitor  GERD -Continue Protonix  Diet:Regular IVF: None 73 Prior to Admission Living Arrangement:home Anticipated  Discharge Location:inpatientbehavioral health Barriers to Discharge:suicidal ideation Dispo: Anticipated transfer to [facility]    Yvette Rack, MD 10/12/2019, 11:31 AM Pager: [phone] Internal Medicine Teaching Service After 5pm  on weekdays and 1pm on weekends: On Call pager: [phone]

## 2019-10-12 NOTE — Progress Notes (Signed)
Paged by RN regarding patient being tremulous and hypertensive. Evaluated at bedside. Patient endorsing 6/10 non-radiating left sided chest pain and trouble breathing. He appears anxious. Vitals obtained at bedside with automatic cuff BP 253/118 and repeat 234/117 on left arm, HR in 80s and oxygen saturation 100% on room air. BP cuff with manual cuff 142/86. EKG obtained at bedside with sinus rhythm and occasional PVCs. DDx includes symptomatic PVCs vs anxiety induced. Will give one dose of PO ativan and continue to monitor.

## 2019-10-12 NOTE — TOC Progression Note (Signed)
Transition of Care [facility]) - Progression Note    Patient Details  Name: Samuel Morrison MRN: [id number] Date of Birth: 23-May-1950  Transition [facility]) CM/SW Contact  Terrial Rhodes, LCSWA Phone Number: 10/12/2019, 12:46 PM  Clinical Narrative:      CSW received a call from [facility] regarding pt disposition. CSW informed Herbert Seta that CSW contacted [facility] and there is no bed availability for pt. Heather informed CSW that she will fax out referral to gero-psych facilities for review. Heather let CSW know she will follow back up with her and CSW [place] with any offers, if they come available.   TOC team will continue to follow.         Expected Discharge Plan and Services           Expected Discharge Date: 10/11/19                                     Social Determinants of Health (SDOH) Interventions    Readmission Risk Interventions No flowsheet data found.

## 2019-10-12 NOTE — TOC Progression Note (Signed)
Transition of Care [facility]) - Progression Note    Patient Details  Name: Samuel Morrison MRN: [id number] Date of Birth: [birth year]  Transition [facility] Care [facility]) CM/SW Contact  Terrial Rhodes, LCSWA Phone Number: 10/12/2019, 12:20 PM  Clinical Narrative:      CSW contacted [facility] to check for bed availability for patient. [facility] confirmed there is no bed availability for patient today.  TOC team will continue to follow.      Expected Discharge Plan and Services           Expected Discharge Date: 10/11/19                                     Social Determinants of Health (SDOH) Interventions    Readmission Risk Interventions No flowsheet data found.

## 2019-10-12 NOTE — Progress Notes (Signed)
Pt continued to have pain of 8, EKG was done and MD notified. MD came to bedside to assess pt and new orders received. Pt voices relieve now and resting comfortably in bed with call light within reach and sitter at bedside.    10/12/19 0206  Vitals  Temp 98.5 F (36.9 C)  Temp Source Oral  BP (!) 133/92  MAP (mmHg) 101  BP Location Right Arm  BP Method Automatic  Patient Position (if appropriate) Lying  Pulse Rate 85  Resp 20  Oxygen Therapy  SpO2 100 %  O2 Device Nasal Cannula  O2 Flow Rate (L/min) 2 L/min  Pain Assessment  Pain Scale 0-10  Pain Score 2  MEWS Score  MEWS Temp 0  MEWS Systolic 0  MEWS Pulse 0  MEWS RR 0  MEWS LOC 0  MEWS Score 0  MEWS Score Color [place] Si

## 2019-10-12 NOTE — Progress Notes (Signed)
Night Team Progress Note  Paged by RN for unresolved CP. Dr. Maryla Morrow and I went to room. Pt reported 8/10, achy, mid-sternal, non-radiating chest pain. Said this pain is not new and has experienced it at home before with relief after ASA. Denied pleuritic pain, n/v, or acute SOB. On exam patient tremulous, non-diaphoretic. BP 232/200 (bilaterally), HR 190s (?Unclear how long pt had been tachycardic and hypertensive - reported discrepancy between monitor in room and tele since last evening, per nursing team). Pulses fast, regular. R/p stat EKG revealed no ST changes, recent trop negx1. Cardiac monitoring reviewed, no arrhythmias. While in room, CP progressively worsened, pt increasingly uncomfortable. Considered multiple etiologies, ACS v dissection v GI. Given hydral, ordered stat CXR, r/p trop.  NG x2 with improvement of sxs soon afterward w/ BP 166/110. Discussed CXR w/ radiology, confirming normal cardiopulmonary vasculature, no dissection. GI cocktail given. Patient then experienced SOB and reported anxiousness. Lung exam unremarkable, SpO2 100%. Patient given 2mg  Ativan. We remained in room until SOB relieved and patient felt calm. SBP 122. No clear etiology. Pain got better with nitro, but EKG and troponin not consistent w/ ACS, will continue to trend troponin. R/o dissection. Pain could be multifactorial. GI etiology, also hypertensive urgency possible  At the same time, anxiety and pain could have caused elevated BP (duration of hypertension is unclear, but sx improved w/ BP control + nitro). Patient resting comfortably 1hr later, per nursing.  , MD Pgr: [phone]

## 2019-10-12 NOTE — Progress Notes (Signed)
Pt remained stable and denies any pain for the remaining of shift. Pt resting comfortably in bed with call light within reach. Reported off to oncoming RN and sitter remains at bedside. Dionne Bucy RN   10/12/19 0404  Vitals  Temp 98.2 F (36.8 C)  Temp Source Oral  BP 117/62  MAP (mmHg) 80  BP Location Right Arm  BP Method Automatic  Patient Position (if appropriate) Lying  Pulse Rate 76  Pulse Rate Source Dinamap  Oxygen Therapy  SpO2 99 %  O2 Device Nasal Cannula  O2 Flow Rate (L/min) 2 L/min

## 2019-10-13 LAB — BASIC METABOLIC PANEL
Anion gap: 9 (ref 5–15)
BUN: 11 mg/dL (ref 8–23)
CO2: 23 mmol/L (ref 22–32)
Calcium: 9.2 mg/dL (ref 8.9–10.3)
Chloride: 112 mmol/L — ABNORMAL HIGH (ref 98–111)
Creatinine, Ser: 0.93 mg/dL (ref 0.61–1.24)
GFR calc Af Amer: >60 mL/min (ref >60)
GFR calc non Af Amer: >60 mL/min (ref >60)
Glucose, Bld: 120 mg/dL — ABNORMAL HIGH (ref 70–99)
Potassium: 3.7 mmol/L (ref 3.5–5.1)
Sodium: 144 mmol/L (ref 135–145)

## 2019-10-13 LAB — GLUCOSE, CAPILLARY
Glucose-Capillary: 94 mg/dL (ref 70–99)
Glucose-Capillary: 80 mg/dL (ref 70–99)

## 2019-10-13 MED ORDER — HYDROXYZINE HCL 25 MG PO TABS
25.0000 mg | ORAL_TABLET | Freq: Four times a day (QID) | ORAL | Status: DC | PRN
  Administered 2019-10-13 – 2019-10-14 (×2): 25 mg via ORAL
  Filled 2019-10-13 (×2): qty 1

## 2019-10-13 MED ORDER — LORAZEPAM 1 MG PO TABS
1.0000 mg | ORAL_TABLET | Freq: Once | ORAL | Status: AC
  Administered 2019-10-13: 1 mg via ORAL
  Filled 2019-10-13: qty 1

## 2019-10-13 MED ORDER — LORAZEPAM 2 MG/ML IJ SOLN
0.5000 mg | Freq: Three times a day (TID) | INTRAMUSCULAR | Status: DC | PRN
  Filled 2019-10-13: qty 1

## 2019-10-13 MED ORDER — PRAZOSIN HCL 1 MG PO CAPS
1.0000 mg | ORAL_CAPSULE | Freq: Every day | ORAL | Status: DC
  Administered 2019-10-13 – 2019-10-14 (×2): 1 mg via ORAL
  Filled 2019-10-13 (×3): qty 1

## 2019-10-13 NOTE — Progress Notes (Signed)
Patient complains of 6/10 L-sided CP and suicidal ideation. States this is similar to the pain he has been having while hospitalized and at home, but less severe than it has been. Denies SOB or diaphoresis. Telemetry unremarkable, troponin pending. Previous episodes had negative ACS workup and improved with Ativan. Vistaril administered without improvement. Ativan 1mg  ordered.

## 2019-10-13 NOTE — Progress Notes (Signed)
Bladder scan showed 451 @ 0426. Attempted In & Out Cath X 1 and was unsuccessful due to obstruction. Pt reported pain but no blood or discharge was observed by RN. Pt voided 175 mL. Bladder scan post void was 291 at 0651 this morning. Will report to dayshift RN.

## 2019-10-13 NOTE — Progress Notes (Addendum)
   Subjective:   Patient had another episode of CP last night, rated 6/10, along with elevated BP. EKG showed normal sinus rhythm with some PVCs. Trop and CXR negative. Pain improved with Ativan. GERD vs anxiety.  He states that he feels well this morning. Denies CP, SOB, abdominal pain. Notes some difficulty urinating, but does produce urine.  Objective:  Vital signs in last 24 hours: Vitals:   10/12/19 2030 10/12/19 2105 10/12/19 2340 10/13/19 0419  BP: (!) 154/94 (!) 142/86 112/75 (!) 118/96  Pulse:   74 62  Resp: 18 18 15 18   Temp: (!) 97.5 F (36.4 C) (!) 97.5 F (36.4 C) 98 F (36.7 C) 98.6 F (37 C)  TempSrc: Oral Oral    SpO2:  95% 98% 96%  Weight:      Height:        Physical Exam Constitutional: no acute distress Head: atraumatic ENT: external ears normal Eyes: EOMI Cardiovascular: regular rate and rhythm, normal heart sounds Pulmonary: effort normal, lungs clear to ascultation bilaterally Abdominal: flat, nontender, no mass, no rebound tenderness, bowel sounds normal Skin: warm and dry Neurological: alert, no focal deficit Psychiatric: flat affect  Assessment/Plan: Scotland Korver is a 69 y.o. male with hx of CVA, MI, COPD, bipolar disorderpresenting withsuicide attempt by overdose with depakote and seroquel.Depakote levels no longer elevated, pending behavioral health placement.   Principal Problem:   Intentional overdose of drug in tablet form (HCC) Active Problems:   Bipolar 1 disorder, depressed, severe (HCC)   Valproic acid toxicity  Suicidal ideation History of the same with prior ED visits.Continued to state that he wanted to die during admission. -Pending placement to behavioral health center  Atypical chest pain -ACS ruled out Had another episode of chest pain overnight with negative EKG, troponin, and CXR. Like previous episodes, resolved with Ativan. Most likely 2/2 anxiety vs GERD. Prn hydralazine and nitroglycerin.  Can consider CTA  of chest to rule out dissection if chest pain recurs -Continue Protonix, Maalox -start hydroxyzine prn -Continue to monitor   Hypertension -Continue home diltiazem  Hx stroke -Continuehome Plavix -Continuehome Lipitor  Hx MI -Continuehome Plavix -Continuehome Lipitor  GERD -Continue Protonix  BPH -start home Prazosin   Diet:Regular IVF: None 73 Prior to Admission Living Arrangement:home Anticipated Discharge Location:inpatientbehavioral health vs home Barriers to Discharge:suicidal ideation which he denies today [facility]: [facility]   [facility], MD 10/13/2019, 6:05 AM Pager: [phone] After 5pm on weekdays and 1pm on weekends: On Call pager [phone]

## 2019-10-14 LAB — GLUCOSE, CAPILLARY
Glucose-Capillary: 86 mg/dL (ref 70–99)
Glucose-Capillary: 103 mg/dL — ABNORMAL HIGH (ref 70–99)

## 2019-10-14 MED ORDER — QUETIAPINE FUMARATE ER 200 MG PO TB24
200.0000 mg | ORAL_TABLET | Freq: Every day | ORAL | Status: DC
  Administered 2019-10-14: 200 mg via ORAL
  Filled 2019-10-14 (×2): qty 1

## 2019-10-14 MED ORDER — DIVALPROEX SODIUM 250 MG PO DR TAB
500.0000 mg | DELAYED_RELEASE_TABLET | Freq: Two times a day (BID) | ORAL | Status: DC
  Administered 2019-10-14 – 2019-10-15 (×2): 500 mg via ORAL
  Filled 2019-10-14 (×2): qty 2

## 2019-10-14 MED ORDER — LORAZEPAM 1 MG PO TABS
1.0000 mg | ORAL_TABLET | Freq: Once | ORAL | Status: AC
  Administered 2019-10-14: 1 mg via ORAL
  Filled 2019-10-14: qty 1

## 2019-10-14 MED ORDER — CALCIUM CARBONATE ANTACID 500 MG PO CHEW
1.0000 | CHEWABLE_TABLET | Freq: Every day | ORAL | Status: DC
  Administered 2019-10-14 – 2019-10-15 (×2): 200 mg via ORAL
  Filled 2019-10-14 (×2): qty 1

## 2019-10-14 MED ORDER — LIDOCAINE 5 % EX PTCH
1.0000 | MEDICATED_PATCH | CUTANEOUS | Status: DC
  Administered 2019-10-14: 1 via TRANSDERMAL
  Filled 2019-10-14: qty 1

## 2019-10-14 NOTE — Consult Note (Signed)
  Patient reassessed face-to-face by nurse practitioner.  Patient currently denies suicidal and homicidal ideations.  Patient states "I lost my mom in 2017 and 2 hours later my brother died, I am usually in the hospital at this time of year." Patient reports readiness to discharge back to sober living,if they will take me back, this is the second time I had a mental admission." Patient states "I just paid my money again on June 29th,it's a six-month program, I am going to stay off crack cocaine this time." Patient denies auditory and visual hallucinations. Patient denies symptoms of paranoia.  Patient reports history of admissions at [facility], [place], [place], [facility],the last couple times they had no trouble getting me into [facility] and I like the place. Patient states "every time I get off of my medications I try to hurt myself." Patient states "if you feel I need to go to inpatient I will not give you any trouble."   Patient reviewed with Dr. Nelly Rout.   Continue to recommend inpatient geriatric psychiatric treatment.  Patient reports has not had home medications for approximately 3 weeks.  Recommend Seroquel XR 200 mg by mouth nightly, and Depakote 500 mg by mouth twice daily.

## 2019-10-14 NOTE — Progress Notes (Signed)
   Subjective:   Patient feels well this morning aside from anxiety. He denies SI at this time and is requesting to go home.  Objective:  Vital signs in last 24 hours: Vitals:   10/13/19 1200 10/13/19 1422 10/13/19 1651 10/13/19 1909  BP: (!) 151/115 (!) 132/99 (!) 136/100 (!) 140/95  Pulse: 98  74 72  Resp: 20  18 17   Temp: 98.5 F (36.9 C)  98.1 F (36.7 C) 98.6 F (37 C)  TempSrc: Oral   Oral  SpO2: 98% 98% 100% 96%  Weight:      Height:        Physical Exam Constitutional: no acute distress Head: atraumatic ENT: external ears normal Eyes: EOMI Cardiovascular: regular rate and rhythm, normal heart sounds Pulmonary: effort normal, lungs clear to ascultation bilaterally Abdominal: flat, nontender, no rebound tenderness, bowel sounds normal Skin: warm and dry Neurological: alert, no focal deficit Psychiatric: normal mood and affect  Assessment/Plan: Samuel Morrison is a 69 y.o. male with hx of CVA, MI, COPD, bipolar disorderpresenting withsuicide attempt by overdose with depakote and seroquel.Depakote levels no longer elevated, pending behavioral health placement. He denies SI at this time   Principal Problem:   Intentional overdose of drug in tablet form (HCC) Active Problems:   Bipolar 1 disorder, depressed, severe (HCC)   Valproic acid toxicity  Suicidal ideation History of the same with prior ED visits.Continued to state that he wanted to die during admission. Denies SI today -Pending placement to behavioral health center -psych consulted to re-evaluate if he is safe for discharge  Depression, bipolar disorder Consider restarting home meds. He sees a psychiatrist monthly for this, states that he is typically well maintained but ran out of his meds recently. I called his pharmacy with [facility] who states that he takes Seroquel XR 400mg  at night and 200mg  in the morning, and Depakote 2000mg  nightly. These are the meds that he overdosed on, last used  on 7/6. -consider restarting home meds, pending psych guidance  Atypical chest pain -ACS ruled out Multiple episodes of L-sided chest pain with negative EKG, troponin, and CXR. Like previous episodes, resolved with Ativan. Most likely 2/2 anxiety vs GERD. Prn hydralazine and nitroglycerin. Can consider CTA of chest to rule out dissection if chest pain recurs -Continue Protonix, Maalox -continue hydroxyzine prn -Continue to monitor  Hypertension -Continue home diltiazem  Hx stroke -Continuehome Plavix -Continuehome Lipitor  Hx MI -Continuehome Plavix -Continuehome Lipitor  GERD -Continue Protonix  BPH -start home Prazosin   Diet:Regular  Prior to Admission Living Arrangement:home Anticipated Discharge Location:inpatientbehavioral health vs home Barriers to Discharge:suicidal ideation which he denies today [facility]  9/6, MD 10/14/2019, 7:25 AM Pager: [phone] After 5pm on weekdays and 1pm on weekends: On Call pager [phone]

## 2019-10-15 ENCOUNTER — Encounter (HOSPITAL_COMMUNITY): Payer: Self-pay | Admitting: Internal Medicine

## 2019-10-15 LAB — GLUCOSE, CAPILLARY: Glucose-Capillary: 97 mg/dL (ref 70–99)

## 2019-10-15 MED ORDER — QUETIAPINE FUMARATE ER 200 MG PO TB24: 200.0000 mg | ORAL_TABLET | Freq: Every day | ORAL | 0 refills | Status: AC

## 2019-10-15 MED ORDER — DIVALPROEX SODIUM 500 MG PO DR TAB: 500.0000 mg | DELAYED_RELEASE_TABLET | Freq: Two times a day (BID) | ORAL | 0 refills | Status: AC

## 2019-10-15 NOTE — Progress Notes (Signed)
   10/14/19 2120  Provider Notification  Provider Name/Title Dr Marijo Conception  Date Provider Notified 10/14/19  Time Provider Notified 2120  Notification Type Page  Notification Reason Other (Comment) (Pt c/o anxiety)  Response See new orders  Date of Provider Response 10/14/19  Time of Provider Response 2124

## 2019-10-15 NOTE — Progress Notes (Signed)
CSW called [facility] to ask about bed availability and left a voicemail. Awaiting a call back.  Blenda Nicely, [place] Clinical Social Worker [phone]

## 2019-10-15 NOTE — Discharge Summary (Addendum)
Name: Samuel Morrison MRN: [id number] DOB: 12-01-1950 69 y.o. PCP: Patient, No Pcp Per  Date of Admission: 10/08/2019  5:45 PM Date of Discharge: 10/15/2019 Attending Physician: Anne Shutter, MD  Discharge Diagnosis: 1. Intentional overdose (Depakote, Seroquel) 2. BPH  Discharge Medications: Allergies as of 10/15/2019       Reactions   Lisinopril Swelling        Medication List     STOP taking these medications    divalproex 500 MG 24 hr tablet Commonly known as: DEPAKOTE ER Replaced by: divalproex 500 MG DR tablet       TAKE these medications    atorvastatin 80 MG tablet Commonly known as: LIPITOR Take 1 tablet (80 mg total) by mouth daily.   clopidogrel 75 MG tablet Commonly known as: PLAVIX Take 1 tablet (75 mg total) by mouth daily.   diltiazem 180 MG 24 hr capsule Commonly known as: CARDIZEM CD Take 1 capsule (180 mg total) by mouth daily.   divalproex 500 MG DR tablet Commonly known as: DEPAKOTE Take 1 tablet (500 mg total) by mouth every 12 (twelve) hours. Replaces: divalproex 500 MG 24 hr tablet   omeprazole 40 MG capsule Commonly known as: PRILOSEC Take 1 capsule (40 mg total) by mouth daily.   prazosin 1 MG capsule Commonly known as: MINIPRESS Take 1 capsule (1 mg total) by mouth at bedtime.   QUEtiapine 200 MG 24 hr tablet Commonly known as: SEROQUEL XR Take 1 tablet (200 mg total) by mouth at bedtime. What changed: Another medication with the same name was removed. Continue taking this medication, and follow the directions you see here.        Disposition and follow-up:   [name] was discharged from [facility] in Stable condition.  At the hospital follow up visit please address:  1.  Follow up      A. Suicidal ideation and depression - further management per psychiatry    2.  Labs / imaging needed at time of follow-up: none  3.  Pending labs/ test needing follow-up: none  Follow-up  Appointments:    Hospital Course by problem list: 1.Suicide attempt - Presented with overdose of Depakote and Seroquel. Received Versed and Ativan and placed on restrain/IVC in ED due to aggression, abusive behavior.  Gave a small amount of activated charcoal. Per poison control, we trended valproate levels until he reacher the therapeutic range. No issues with QT interval. Noted to be bradycardic initially, improved to 60s-70s. Urinary retention was noted in the ED and relieved with I&O cath. Home prazosin resumed and voiding difficulty resolved. Had nausea which has improved spontaneously, tolerating diet. Mild hypokalemia repleted. Tardive dyskinesia was noted, patients reports years-long history of the same. Had episodes of atypical chest, see below. Patient was medically cleared, but he continued to state that he "wanted to die." Started back on his home medications at lower dose: Seroquel XR 200mg  daily, Valproate 500mg  BID.   2. Atypical chest pain - Hx of CAD. Patient complained of L-sided chest pain several times during admission. He reports this is similar to pain that he has had before. Multiple EKGs without ischemic changes, troponin negative x3, CXR negative.   3. BPH - Patient developed voiding difficulty on second day of admission. Anti-cholinergic overdose vs BPH. Relieved with I&O catheter. Symptoms improved after resuming prazosin.  Discharge Vitals:   BP (!) 123/95 (BP Location: Right Arm)   Pulse 75   Temp 97.9 F (36.6 C) (Oral)  Resp 18   Ht 5\' 9"  (1.753 m)   Wt 70.9 kg   SpO2 99%   BMI 23.08 kg/m   Pertinent Labs, Studies, and Procedures:   No results for input(s): VALPROATE in the last 72 hours. BMP Latest Ref Rng & Units 10/13/2019 10/11/2019 10/10/2019  Glucose 70 - 99 mg/dL 12/11/2019) 433(I) 951(O)  BUN 8 - 23 mg/dL 11 11 9   Creatinine 0.61 - 1.24 mg/dL 841(Y 6.06  Sodium 135 - 145 mmol/L 144 140 141  Potassium 3.5 - 5.1 mmol/L 3.7 3.3(L) 3.6  Chloride 98 - 111  mmol/L 112(H) 109 111  CO2 22 - 32 mmol/L 23 24 21(L)  Calcium 8.9 - 10.3 mg/dL 9.2 3.01) 6.01)    High sensitivity troponin levels: 10/11/2019: 5 10/12/2019: 5 10/12/2019: 4  DG CHEST PORT 1 VIEW  Result Date: 10/12/2019 CLINICAL DATA:  Respiratory distress. EXAM: PORTABLE CHEST 1 VIEW COMPARISON:  None. FINDINGS: Mild atelectasis and/or infiltrate is seen within the retrocardiac region of the left lung base. A trace amount of atelectasis is seen within the right lung base. The heart size and mediastinal contours are within normal limits. The visualized skeletal structures are unremarkable. IMPRESSION: Mild atelectasis and/or infiltrate within the retrocardiac region of the left lung base. Electronically Signed   By: 12/13/2019 M.D.   On: 10/12/2019 02:05    Discharge Instructions: Discharge Instructions     Call MD for:  extreme fatigue   Complete by: As directed    Call MD for:  extreme fatigue   Complete by: As directed    Call MD for:  persistant dizziness or light-headedness   Complete by: As directed    Call MD for:  persistant dizziness or light-headedness   Complete by: As directed    Diet - low sodium heart healthy   Complete by: As directed    Discharge instructions   Complete by: As directed    [name], it was a pleasure taking care of you. We are transferring you to our behavioral health colleagues. I hope they can provide the help that you need.  We are holding your Seroquel and Depakote, and will defer to behavioral health on your psychiatric medications.   Discharge instructions   Complete by: As directed    [name], it has been a pleasure taking care of you. Here are your discharge instructions.   1. RESTART your Depakote at a REDUCED dose of 500mg  twice daily 2. RESTART you Seroquel XR at a REDUCED dose of 200mg  daily 3. Follow up with psychiatry after your discharge.   Increase activity slowly   Complete by: As directed         Signed: Roxan Hockey, MD 10/15/2019, 1:28 PM   Pager: [phone]

## 2019-10-15 NOTE — TOC Transition Note (Signed)
Transition of Care [facility]) - CM/SW Discharge Note   Patient Details  Name: Nevada Mullett MRN: [id number] Date of Birth: [birth year]  Transition of Care [facility]) CM/SW Contact:  Baldemar Lenis, LCSW Phone Number: 10/15/2019, 2:29 PM   Clinical Narrative:   Patient has been accepted at [facility] for inpatient geri-psych. Accepting physician is Luanna Cole, and patient will be going to the [facility] Unit. IVC renewed prior to patient transfer.  Nurse to call report to [phone].     Final next level of care: Skilled Nursing Facility Barriers to Discharge: Barriers Resolved   Patient Goals and CMS Choice        Discharge Placement                Patient to be transferred to facility by: [facility]   Patient and family notified of of transfer: 10/15/19  Discharge Plan and Services                                     Social Determinants of Health (SDOH) Interventions     Readmission Risk Interventions No flowsheet data found.

## 2019-10-15 NOTE — Progress Notes (Signed)
CSW contacted [facility] to ask about bed availability. [facility] recommended searching for geri-psych. CSW advocated that [facility] was going to take the patient from the ED before admission, but they are still suggesting that CSW look elsewhere for placement.   CSW contacted [facility], [facility], and [facility]. CSW provided referral information, they will review and call back if able to take the patient today.  Blenda Nicely, [place] Clinical Social Worker [phone]

## 2019-10-15 NOTE — Progress Notes (Signed)
° °  Subjective:   Patient with no acute complaints this morning besides wanting to go home. Re-evaluated by psych yesterday who continue to recommend gero-[facility].   Objective:  Vital signs in last 24 hours: Vitals:   10/14/19 1932 10/14/19 2045 10/14/19 2315 10/15/19 0331  BP: (!) 143/85 (!) 140/93 119/88 121/80  Pulse:  (!) 59 87 80  Resp:  20 19 15   Temp:  98.9 F (37.2 C) 98.6 F (37 C) 98.4 F (36.9 C)  TempSrc:  Oral Oral Oral  SpO2:  96% 94% 100%  Weight:      Height:        Physical Exam Constitutional: no acute distress Head: atraumatic ENT: external ears normal Eyes: EOMI Cardiovascular: regular rate and rhythm, normal heart sounds Pulmonary: effort normal, lungs clear to ascultation bilaterally Abdominal: flat, nontender, no rebound tenderness, bowel sounds normal Skin: warm and dry Neurological: alert, no focal deficit Psychiatric: normal mood and affect  Assessment/Plan: [facility] is a 69 y.o. male with hx of CVA, MI, COPD, bipolar disorderpresenting withsuicide attempt by overdose with depakote and seroquel.Depakote levels no longer elevated, pending behavioral health placement. He denies SI at this time, but psych continues to recommend [facility], pending placement.   Principal Problem:   Intentional overdose of drug in tablet form (HCC) Active Problems:   Bipolar 1 disorder, depressed, severe (HCC)   Valproic acid toxicity  Suicidal ideation History of the same with prior ED visits.Continued to state that he wanted to die during admission, but recently began denying SI. Re-evaluated by behavioral health to recommend gero-[facility]. -pending placement to [facility]  Depression, bipolar disorder Restarted home meds per psych. I called his pharmacy with [facility] who states that he takes Seroquel XR 400mg  at night and 200mg  in the morning, and Depakote 2000mg  nightly. These are the meds that he overdosed on, last used on 7/6. -start Depakote 500mg  BID -start  Seroquel 200mg   Atypical chest pain -ACS ruled out Multiple episodes of L-sided chest pain with negative EKG, troponin, and CXR. Like previous episodes, resolved with Ativan. Most likely 2/2 anxiety vs GERD. Prn hydralazine and nitroglycerin. Can consider CTA of chest to rule out dissection if chest pain recurs -Continue Protonix, Maalox -continue hydroxyzine prn -Continue to monitor  Hypertension -Continue home diltiazem  Hx stroke -Continuehome Plavix -Continuehome Lipitor  Hx MI -Continuehome Plavix -Continuehome Lipitor  GERD -Continue Protonix  BPH Voiding more easily now since starting Prazosin -continue home Prazosin   Diet:Regular  Prior to Admission Living Arrangement:home Anticipated Discharge Location:inpatientbehavioral health Barriers to Discharge:suicidal ideation  [facility]  9/6, MD 10/15/2019, 6:24 AM Pager: [phone] After 5pm on weekdays and 1pm on weekends: On Call pager [phone]

## 2019-10-22 ENCOUNTER — Emergency Department (HOSPITAL_COMMUNITY)
Admission: EM | Admit: 2019-10-22 | Discharge: 2019-10-23 | Disposition: A | Discharge status: Other Acute Inpatient Hospital | Payer: Medicare HMO | Attending: Emergency Medicine | Admitting: Emergency Medicine

## 2019-10-22 ENCOUNTER — Other Ambulatory Visit: Payer: Self-pay

## 2019-10-22 ENCOUNTER — Emergency Department (HOSPITAL_COMMUNITY): Payer: Medicare HMO

## 2019-10-22 ENCOUNTER — Encounter (HOSPITAL_COMMUNITY): Payer: Self-pay | Admitting: Emergency Medicine

## 2019-10-22 DIAGNOSIS — J449 Chronic obstructive pulmonary disease, unspecified: Secondary | ICD-10-CM | POA: Insufficient documentation

## 2019-10-22 DIAGNOSIS — Z20822 Contact with and (suspected) exposure to covid-19: Secondary | ICD-10-CM | POA: Diagnosis not present

## 2019-10-22 DIAGNOSIS — R45851 Suicidal ideations: Secondary | ICD-10-CM | POA: Diagnosis not present

## 2019-10-22 DIAGNOSIS — F17210 Nicotine dependence, cigarettes, uncomplicated: Secondary | ICD-10-CM | POA: Insufficient documentation

## 2019-10-22 DIAGNOSIS — Z79899 Other long term (current) drug therapy: Secondary | ICD-10-CM | POA: Diagnosis not present

## 2019-10-22 DIAGNOSIS — F332 Major depressive disorder, recurrent severe without psychotic features: Secondary | ICD-10-CM | POA: Diagnosis not present

## 2019-10-22 DIAGNOSIS — R079 Chest pain, unspecified: Secondary | ICD-10-CM | POA: Insufficient documentation

## 2019-10-22 DIAGNOSIS — D649 Anemia, unspecified: Secondary | ICD-10-CM

## 2019-10-22 DIAGNOSIS — D691 Qualitative platelet defects: Secondary | ICD-10-CM | POA: Diagnosis not present

## 2019-10-22 DIAGNOSIS — I2510 Atherosclerotic heart disease of native coronary artery without angina pectoris: Secondary | ICD-10-CM | POA: Diagnosis not present

## 2019-10-22 LAB — BASIC METABOLIC PANEL
Anion gap: 9 (ref 5–15)
BUN: 6 mg/dL — ABNORMAL LOW (ref 8–23)
CO2: 26 mmol/L (ref 22–32)
Calcium: 9.1 mg/dL (ref 8.9–10.3)
Chloride: 107 mmol/L (ref 98–111)
Creatinine, Ser: 0.82 mg/dL (ref 0.61–1.24)
GFR calc Af Amer: >60 mL/min (ref >60)
GFR calc non Af Amer: >60 mL/min (ref >60)
Glucose, Bld: 134 mg/dL — ABNORMAL HIGH (ref 70–99)
Potassium: 4 mmol/L (ref 3.5–5.1)
Sodium: 142 mmol/L (ref 135–145)

## 2019-10-22 LAB — CBC
HCT: 38.2 % — ABNORMAL LOW (ref 39.0–52.0)
Hemoglobin: 12.5 g/dL — ABNORMAL LOW (ref 13.0–17.0)
MCH: 30 pg (ref 26.0–34.0)
MCHC: 32.7 g/dL (ref 30.0–36.0)
MCV: 91.6 fL (ref 80.0–100.0)
Platelets: 130 10*3/uL — ABNORMAL LOW (ref 150–400)
RBC: 4.17 MIL/uL — ABNORMAL LOW (ref 4.22–5.81)
RDW: 15.1 % (ref 11.5–15.5)
WBC: 8.1 10*3/uL (ref 4.0–10.5)
nRBC: 0 % (ref 0.0–0.2)

## 2019-10-22 LAB — ACETAMINOPHEN LEVEL: Acetaminophen (Tylenol), Serum: <10 ug/mL — ABNORMAL LOW (ref 10–30)

## 2019-10-22 LAB — ETHANOL: Alcohol, Ethyl (B): <10 mg/dL (ref <10)

## 2019-10-22 LAB — RAPID URINE DRUG SCREEN, HOSP PERFORMED
Amphetamines: NOT DETECTED
Barbiturates: NOT DETECTED
Benzodiazepines: NOT DETECTED
Cocaine: NOT DETECTED
Opiates: NOT DETECTED
Tetrahydrocannabinol: NOT DETECTED

## 2019-10-22 LAB — TROPONIN I (HIGH SENSITIVITY): Troponin I (High Sensitivity): 6 ng/L (ref <18)

## 2019-10-22 LAB — SALICYLATE LEVEL: Salicylate Lvl: <7 mg/dL — ABNORMAL LOW (ref 7.0–30.0)

## 2019-10-22 MED ORDER — SODIUM CHLORIDE 0.9% FLUSH: 3.0000 mL | Freq: Once | INTRAVENOUS | Status: DC
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
## 2019-10-22 NOTE — ED Triage Notes (Addendum)
Pt presents to ED BIB [facility] from [facility]. Pt c/o L CP that radiates to L side of neck. Pt reports he is going to run out into traffic. D/t stress from living situation   EMS 140/90 90 - hr 18 - rr

## 2019-10-23 LAB — SARS CORONAVIRUS 2 BY RT PCR (HOSPITAL ORDER, PERFORMED IN ~~LOC~~ HOSPITAL LAB): SARS Coronavirus 2: NEGATIVE

## 2019-10-23 LAB — TROPONIN I (HIGH SENSITIVITY): Troponin I (High Sensitivity): 5 ng/L (ref <18)

## 2019-10-23 MED ORDER — PRAZOSIN HCL 1 MG PO CAPS
1.0000 mg | ORAL_CAPSULE | Freq: Every day | ORAL | Status: DC
  Administered 2019-10-23: 1 mg via ORAL
  Filled 2019-10-23 (×2): qty 1

## 2019-10-23 MED ORDER — DILTIAZEM HCL ER COATED BEADS 180 MG PO CP24
180.0000 mg | ORAL_CAPSULE | Freq: Every day | ORAL | Status: DC
  Administered 2019-10-23: 180 mg via ORAL
  Filled 2019-10-23: qty 1

## 2019-10-23 MED ORDER — LORAZEPAM 0.5 MG PO TABS
0.5000 mg | ORAL_TABLET | Freq: Four times a day (QID) | ORAL | Status: DC | PRN
  Administered 2019-10-23: 0.5 mg via ORAL
  Filled 2019-10-23: qty 1

## 2019-10-23 MED ORDER — ATORVASTATIN CALCIUM 80 MG PO TABS
80.0000 mg | ORAL_TABLET | Freq: Every day | ORAL | Status: DC
  Administered 2019-10-23: 80 mg via ORAL

## 2019-10-23 MED ORDER — PANTOPRAZOLE SODIUM 40 MG PO TBEC
40.0000 mg | DELAYED_RELEASE_TABLET | Freq: Every day | ORAL | Status: DC
  Administered 2019-10-23: 40 mg via ORAL
  Filled 2019-10-23: qty 1

## 2019-10-23 MED ORDER — QUETIAPINE FUMARATE ER 200 MG PO TB24
200.0000 mg | ORAL_TABLET | Freq: Every day | ORAL | Status: DC
  Administered 2019-10-23: 200 mg via ORAL
  Filled 2019-10-23 (×2): qty 1

## 2019-10-23 MED ORDER — ACETAMINOPHEN 325 MG PO TABS: 650.0000 mg | ORAL_TABLET | ORAL | Status: DC | PRN

## 2019-10-23 MED ORDER — ONDANSETRON HCL 4 MG PO TABS: 4.0000 mg | ORAL_TABLET | Freq: Three times a day (TID) | ORAL | Status: DC | PRN

## 2019-10-23 MED ORDER — CLOPIDOGREL BISULFATE 75 MG PO TABS
75.0000 mg | ORAL_TABLET | Freq: Every day | ORAL | Status: DC
  Administered 2019-10-23: 75 mg via ORAL
  Filled 2019-10-23: qty 1

## 2019-10-23 MED ORDER — DIVALPROEX SODIUM 250 MG PO DR TAB
500.0000 mg | DELAYED_RELEASE_TABLET | Freq: Two times a day (BID) | ORAL | Status: DC
  Administered 2019-10-23 (×2): 500 mg via ORAL
  Filled 2019-10-23 (×2): qty 2

## 2019-10-23 MED ORDER — NICOTINE 7 MG/24HR TD PT24
7.0000 mg | MEDICATED_PATCH | Freq: Every day | TRANSDERMAL | Status: DC
  Filled 2019-10-23: qty 1

## 2019-10-23 MED ORDER — ALUM & MAG HYDROXIDE-SIMETH 200-200-20 MG/5ML PO SUSP: 30.0000 mL | Freq: Four times a day (QID) | ORAL | Status: DC | PRN

## 2019-10-23 NOTE — ED Notes (Signed)
Safe transport called for pt to be transferred to [facility]

## 2019-10-23 NOTE — ED Notes (Signed)
Breakfast ordered--Alvino Lechuga 

## 2019-10-23 NOTE — ED Notes (Signed)
Pt dinner ordered.
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
## 2019-10-23 NOTE — BH Assessment (Signed)
Clinician contacted [facility] in an attempt to complete pt's BH Assessment. Clinician requested to speak to pt's RN and was put on hold and the line was not picked back up after several minutes. TTS will call back at a later time.

## 2019-10-23 NOTE — BH Assessment (Signed)
Per [facility], patient accepted to [facility] for admission today. The accepting provider is Dr. Estill Cotta. Nurse report [phone]. Patient's nurse Amada Jupiter, RN made aware.

## 2019-10-23 NOTE — BH Assessment (Signed)
Case was staffed with Thomas NP who recommended a inpatient admission to assist with stabilization. 

## 2019-10-23 NOTE — ED Notes (Signed)
Lunch Tray Ordered @ 1023. 

## 2020-08-13 IMAGING — CT CT ABD-PELV W/ CM
2 of 5 series · 16 of 46 positions shown, 18 images · IV contrast (omnipaque)
Comparison: CT, 09/03/2019

CLINICAL DATA: Left upper abdominal pain worsening over the last 3
weeks.

EXAM:
CT ABDOMEN AND PELVIS WITH CONTRAST
TECHNIQUE: Multidetector CT imaging of the abdomen and pelvis was performed
using the standard protocol following bolus administration of
intravenous contrast.
CONTRAST:  100mL OMNIPAQUE IOHEXOL 300 MG/ML  SOLN

[Series 2: axial st · axial · 0.74mm/px · z∈[-701,-341]mm · 13 of 84 slices shown, 15 images]
[im 6/84  soft-tissue]
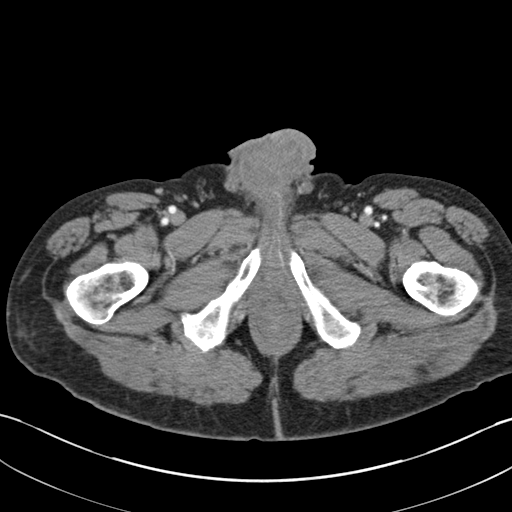
[im 6/84  bone]
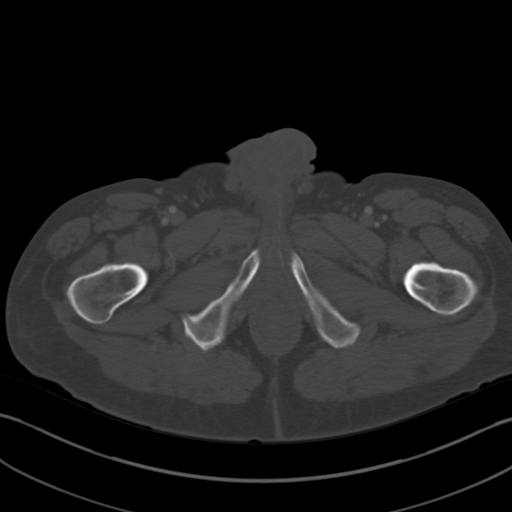
[im 11/84  soft-tissue]
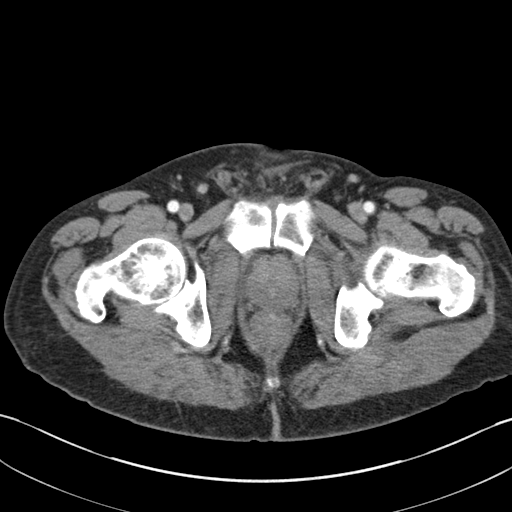
[im 16/84  soft-tissue]
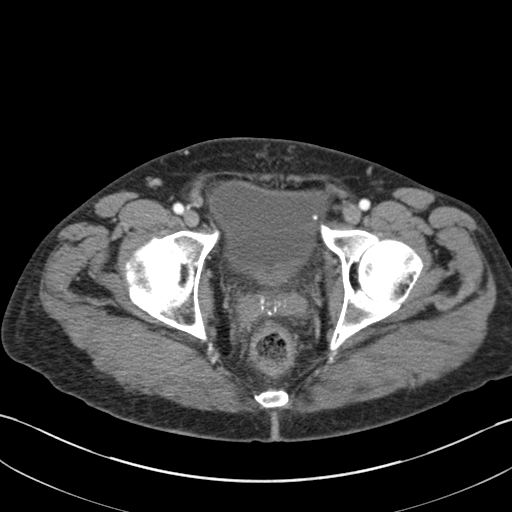
[im 26/84  soft-tissue]
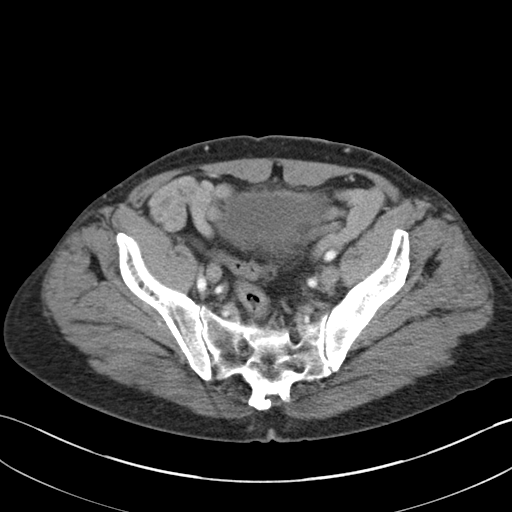
[im 32/84  soft-tissue]
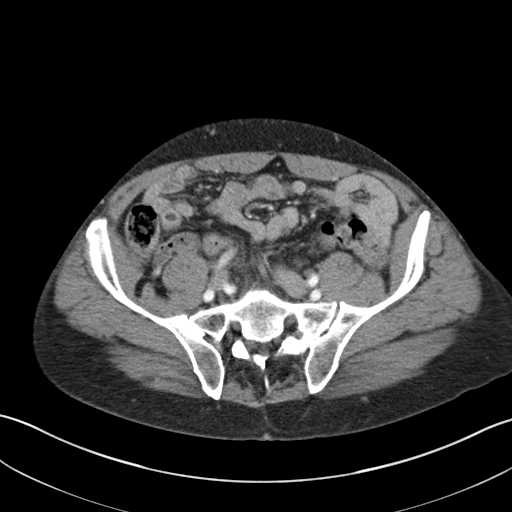
[im 37/84  soft-tissue]
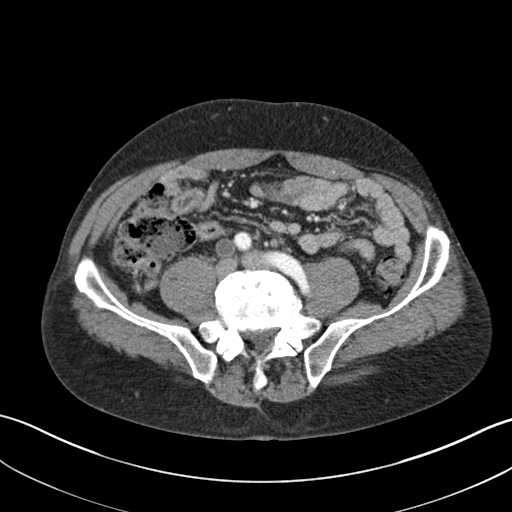
[im 42/84  soft-tissue]
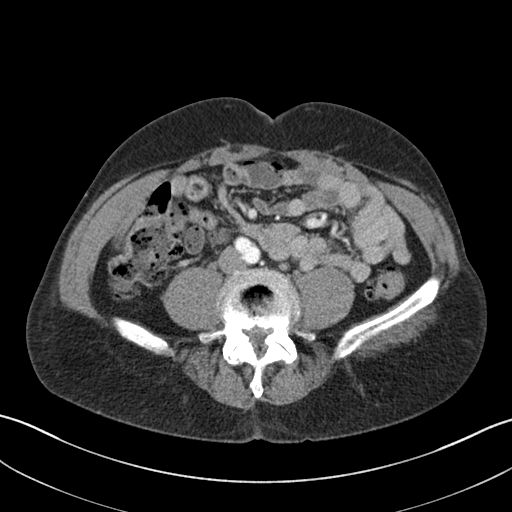
[im 47/84  soft-tissue]
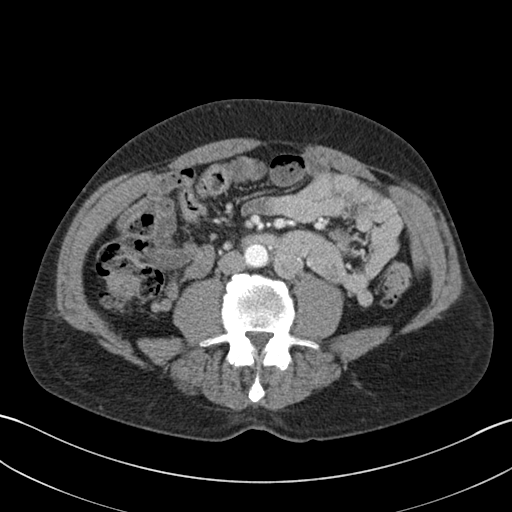
[im 52/84  soft-tissue]
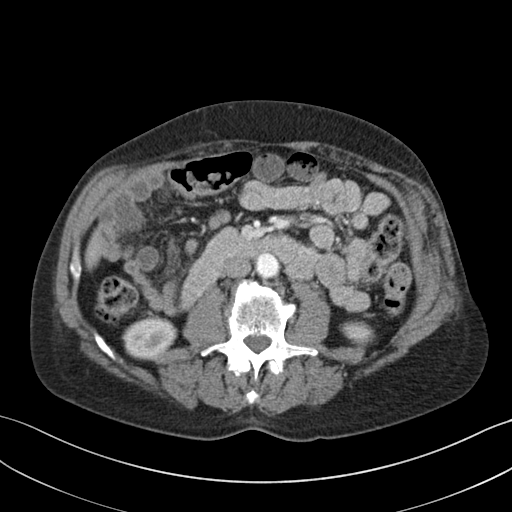
[im 52/84  bone]
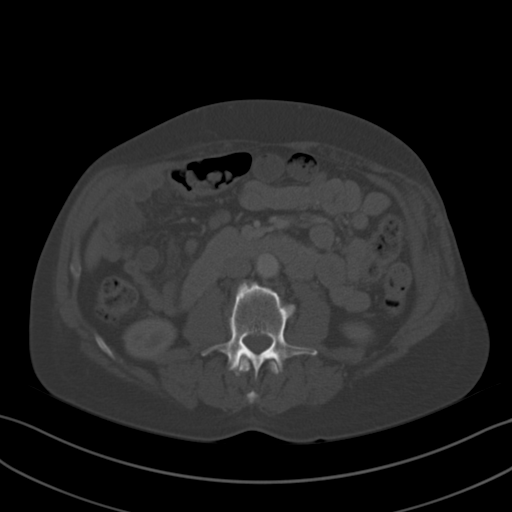
[im 58/84  soft-tissue]
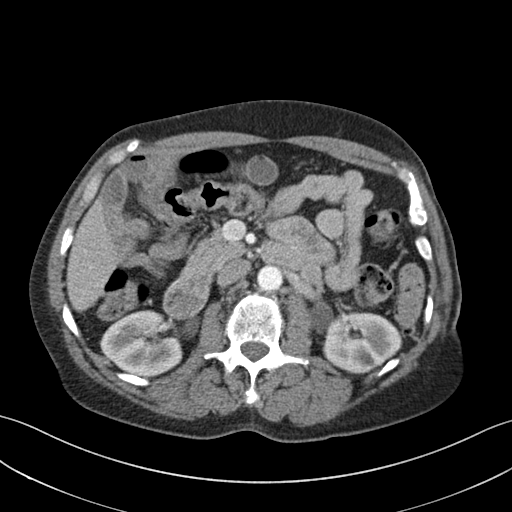
[im 68/84  soft-tissue]
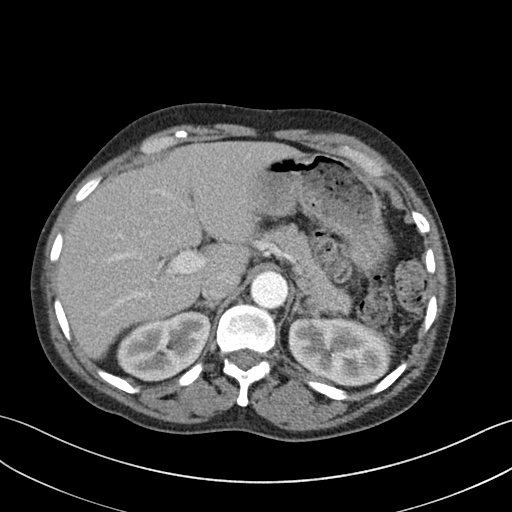
[im 73/84  soft-tissue]
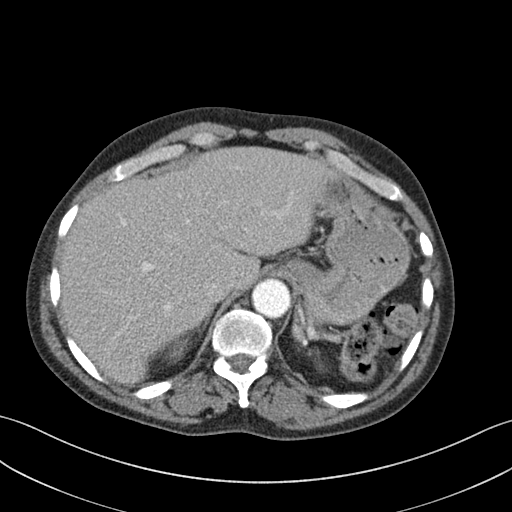
[im 78/84  soft-tissue]
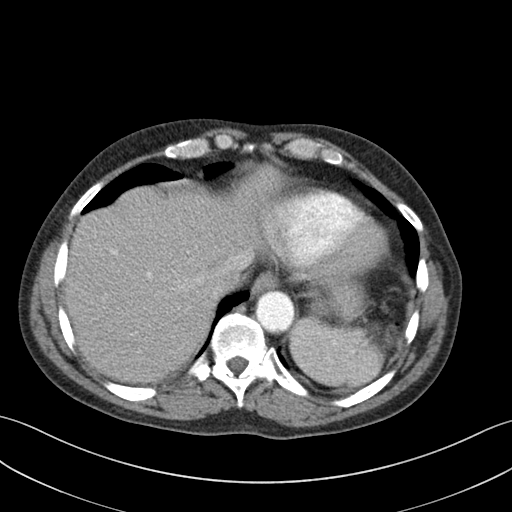

[Series 5: coronal st · coronal · 0.66mm/px · 3 of 127 slices shown]
[im 43/127  soft-tissue]
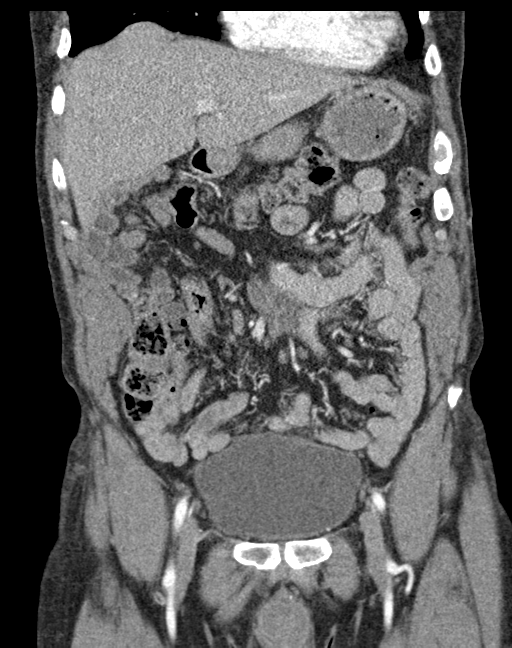
[im 57/127  soft-tissue]
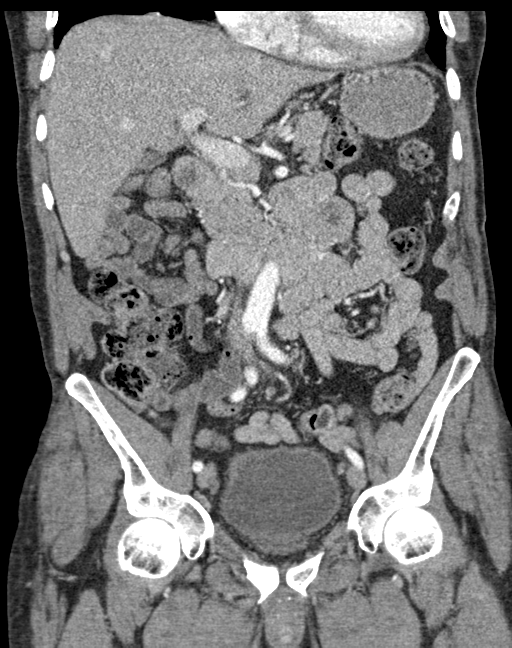
[im 71/127  soft-tissue]
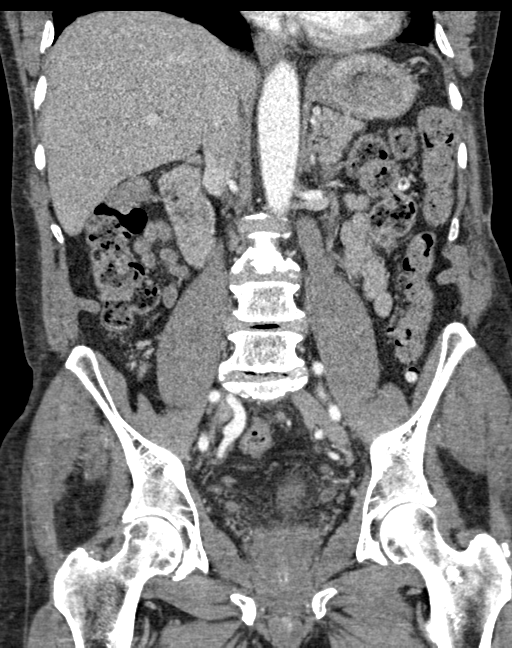

[16 of 46 positions shown; findings below may reference images not displayed]

FINDINGS: Lower chest: Patchy airspace opacities noted at the lung bases,
which may reflect atelectasis abdominal infection or a combination.
Atelectasis favored, and the opacities are without change from the
prior CT.

Hepatobiliary: No focal liver abnormality is seen. No gallstones,
gallbladder wall thickening, or biliary dilatation.

Pancreas: Unremarkable. No pancreatic ductal dilatation or
surrounding inflammatory changes.

Spleen: Normal in size without focal abnormality.

Adrenals/Urinary Tract: 2.6 cm left adrenal nodule. Nodule decreases
from an average of 58 Hounsfield units to 33 Hounsfield units to in
the initial and delayed post-contrast sequences consistent with a
relative washout of 43% consistent with an adenoma. Kidneys normal
in size, orientation and position with symmetric enhancement and
excretion. Small low-density lesion, lower pole the left kidney,
consistent with a cyst. No other renal masses or lesions. No stones.
No hydronephrosis. Normal ureters. Normal bladder.

Stomach/Bowel: Stomach is within normal limits. Appendix appears
normal. No evidence of bowel wall thickening, distention, or
inflammatory changes.

Vascular/Lymphatic: No significant vascular findings are present. No
enlarged abdominal or pelvic lymph nodes.

Reproductive: Unremarkable.

Other: No abdominal wall hernia or abnormality. No abdominopelvic
ascites.

Musculoskeletal: No fracture or acute finding. No osteoblastic or
osteolytic lesions.
IMPRESSION: 1. No acute findings within the abdomen or pelvis.
2. Patchy lung base opacities that are stable from the prior CT.
Suspect atelectasis may consider pneumonia in the proper clinical
setting
3. Stable left adrenal adenoma.

## 2020-09-15 IMAGING — DX DG CHEST 1V PORT
1 series · 1 of 1 positions shown · non-contrast
Comparison: None.

CLINICAL DATA: Respiratory distress.

EXAM:
PORTABLE CHEST 1 VIEW

[chest ap]
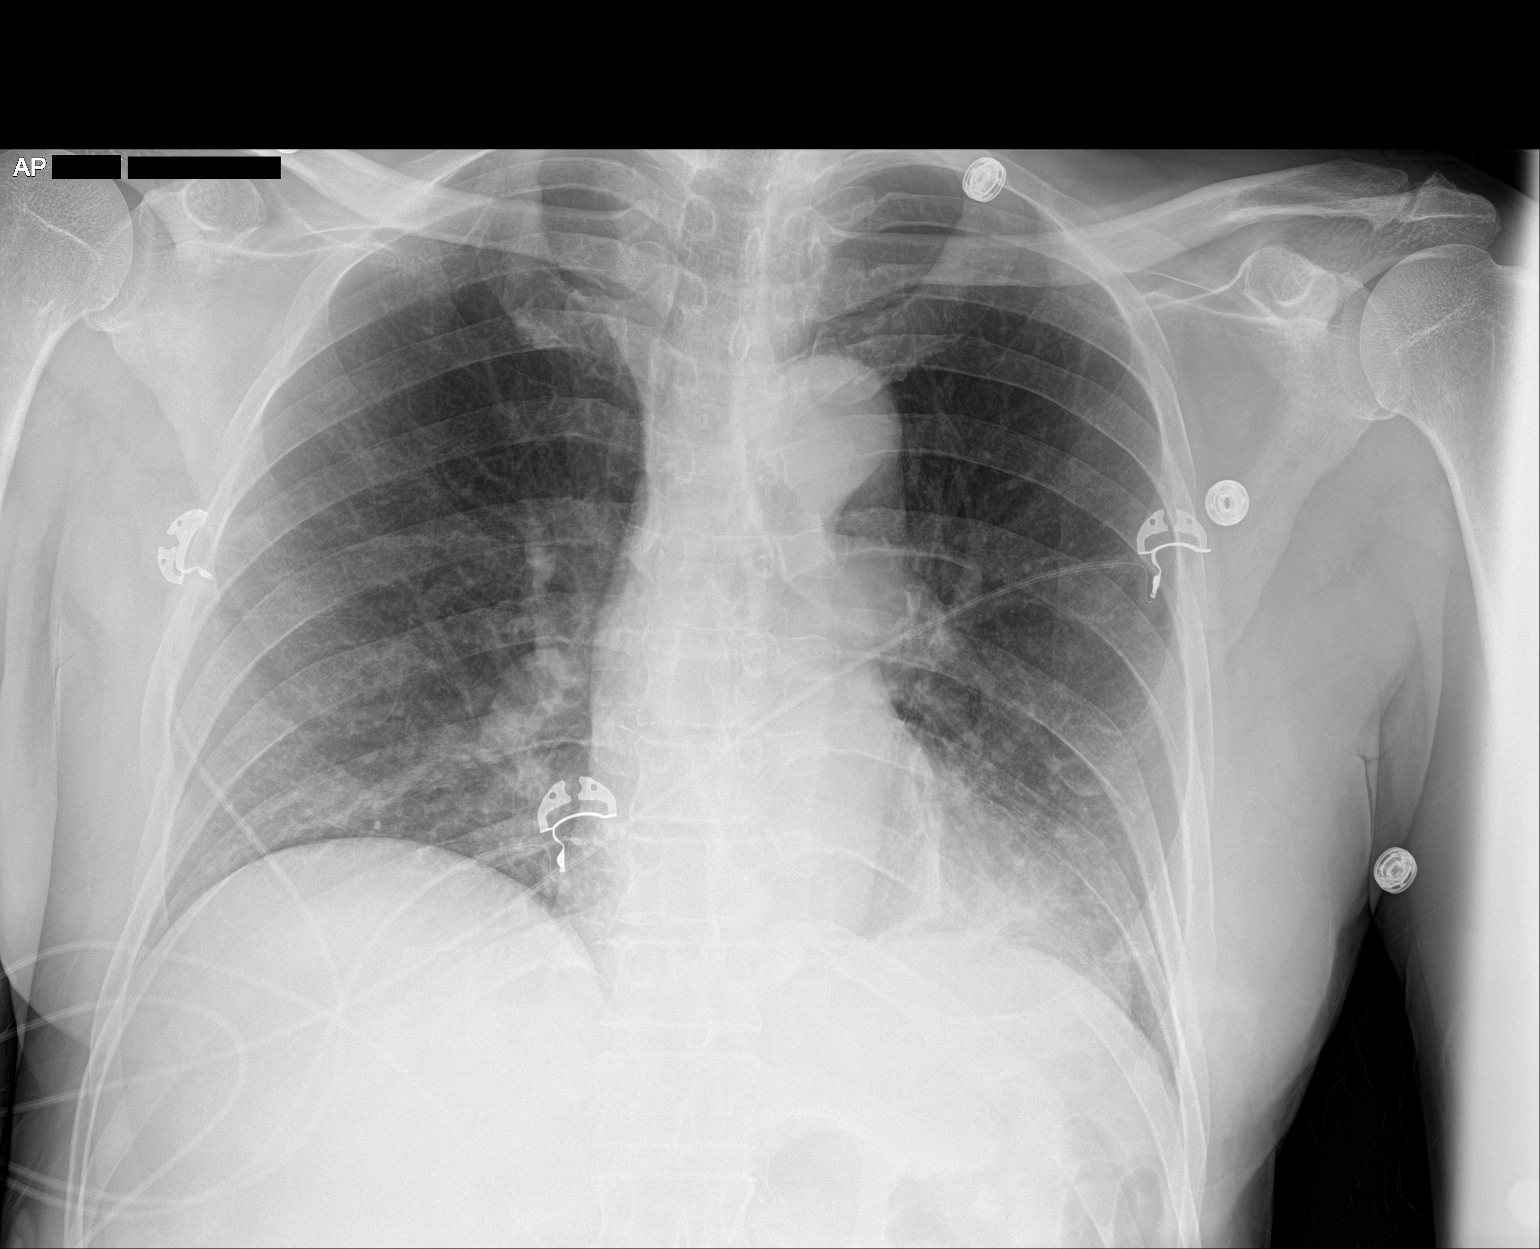

[1 of 1 positions shown; findings below may reference images not displayed]

FINDINGS: Mild atelectasis and/or infiltrate is seen within the retrocardiac
region of the left lung base. A trace amount of atelectasis is seen
within the right lung base. The heart size and mediastinal contours
are within normal limits. The visualized skeletal structures are
unremarkable.
IMPRESSION: Mild atelectasis and/or infiltrate within the retrocardiac region of
the left lung base.

## 2020-09-25 IMAGING — CR DG CHEST 2V
2 series · 2 of 2 positions shown · non-contrast
Comparison: 10/12/2019

CLINICAL DATA: Chest pain

EXAM:
CHEST - 2 VIEW

[chest pa]
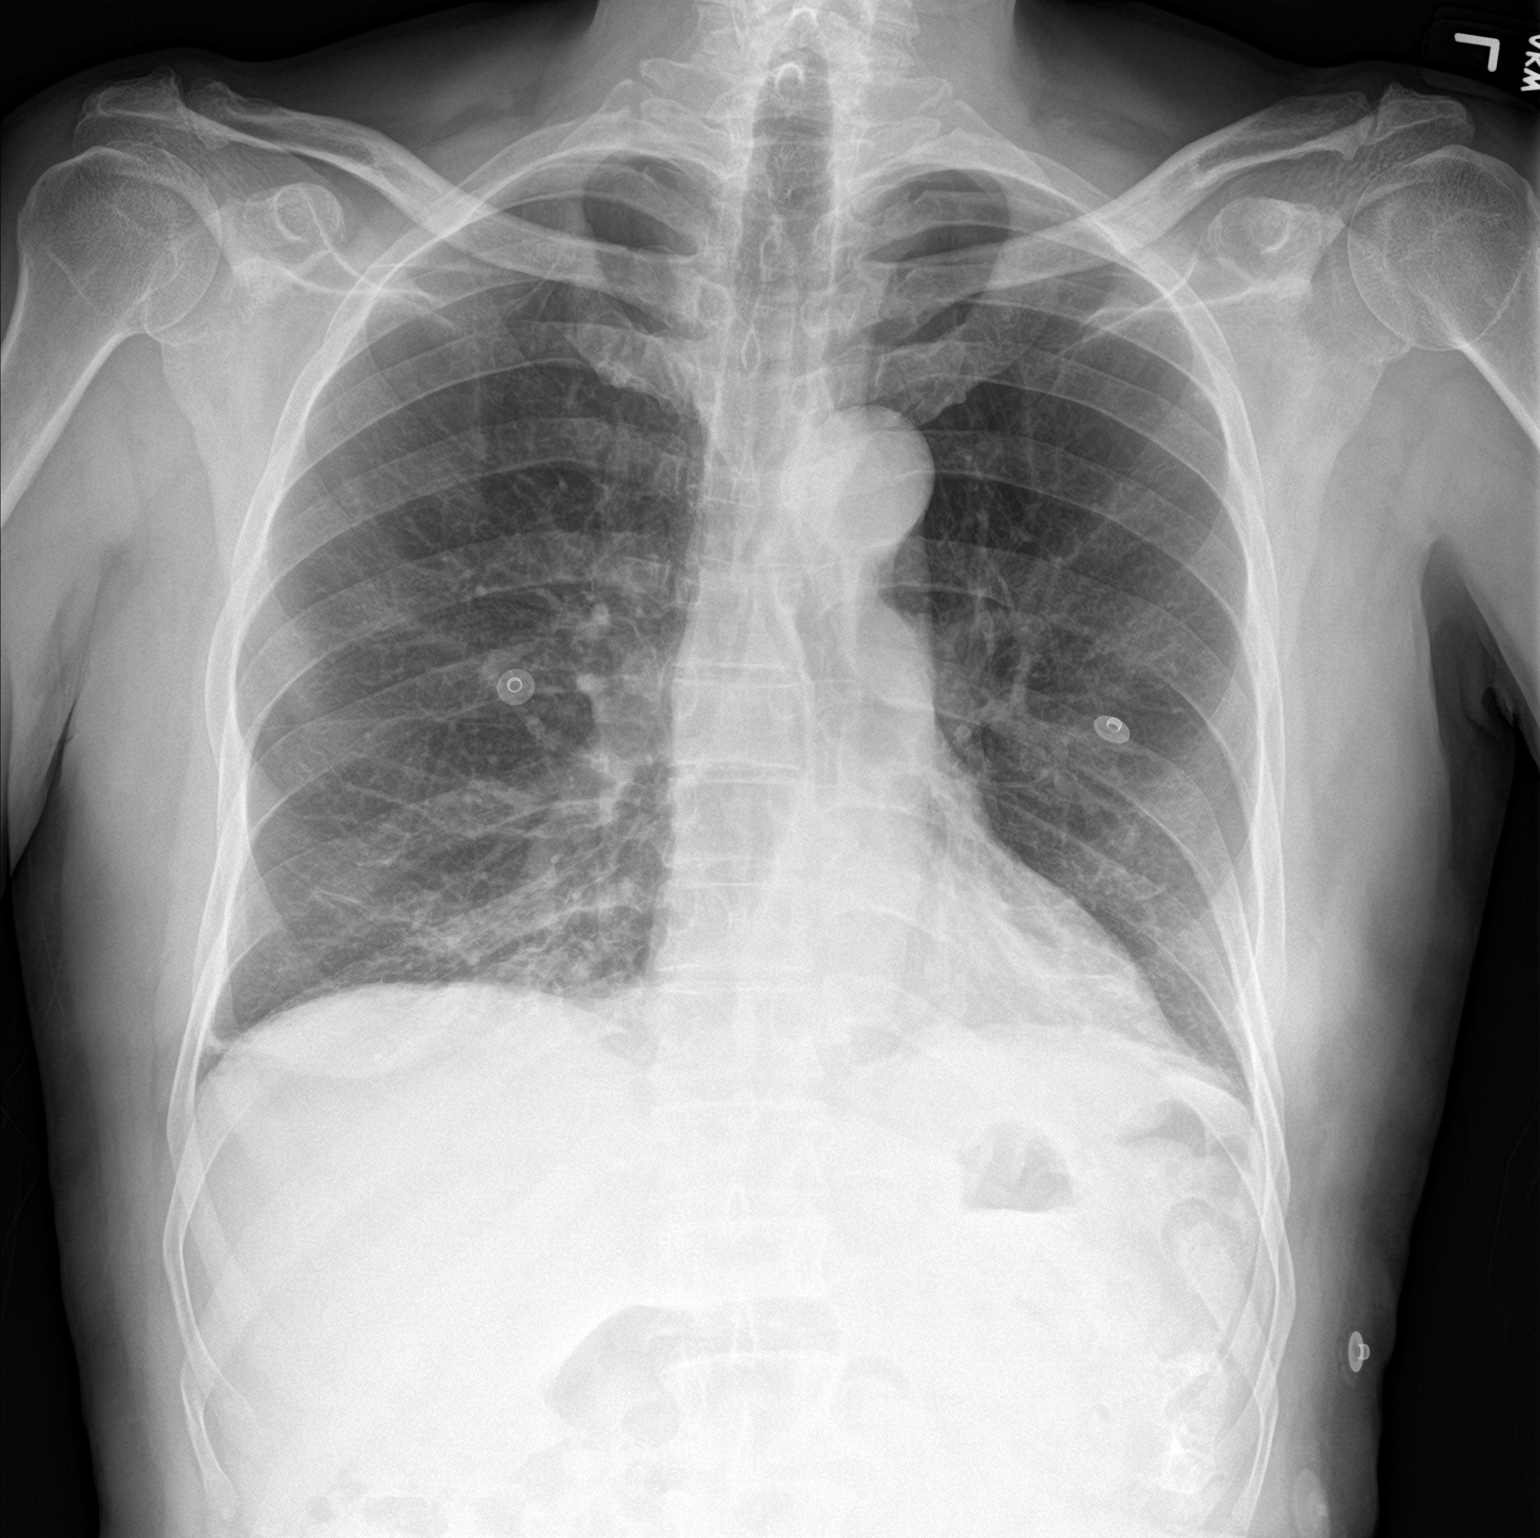

[chest lat]
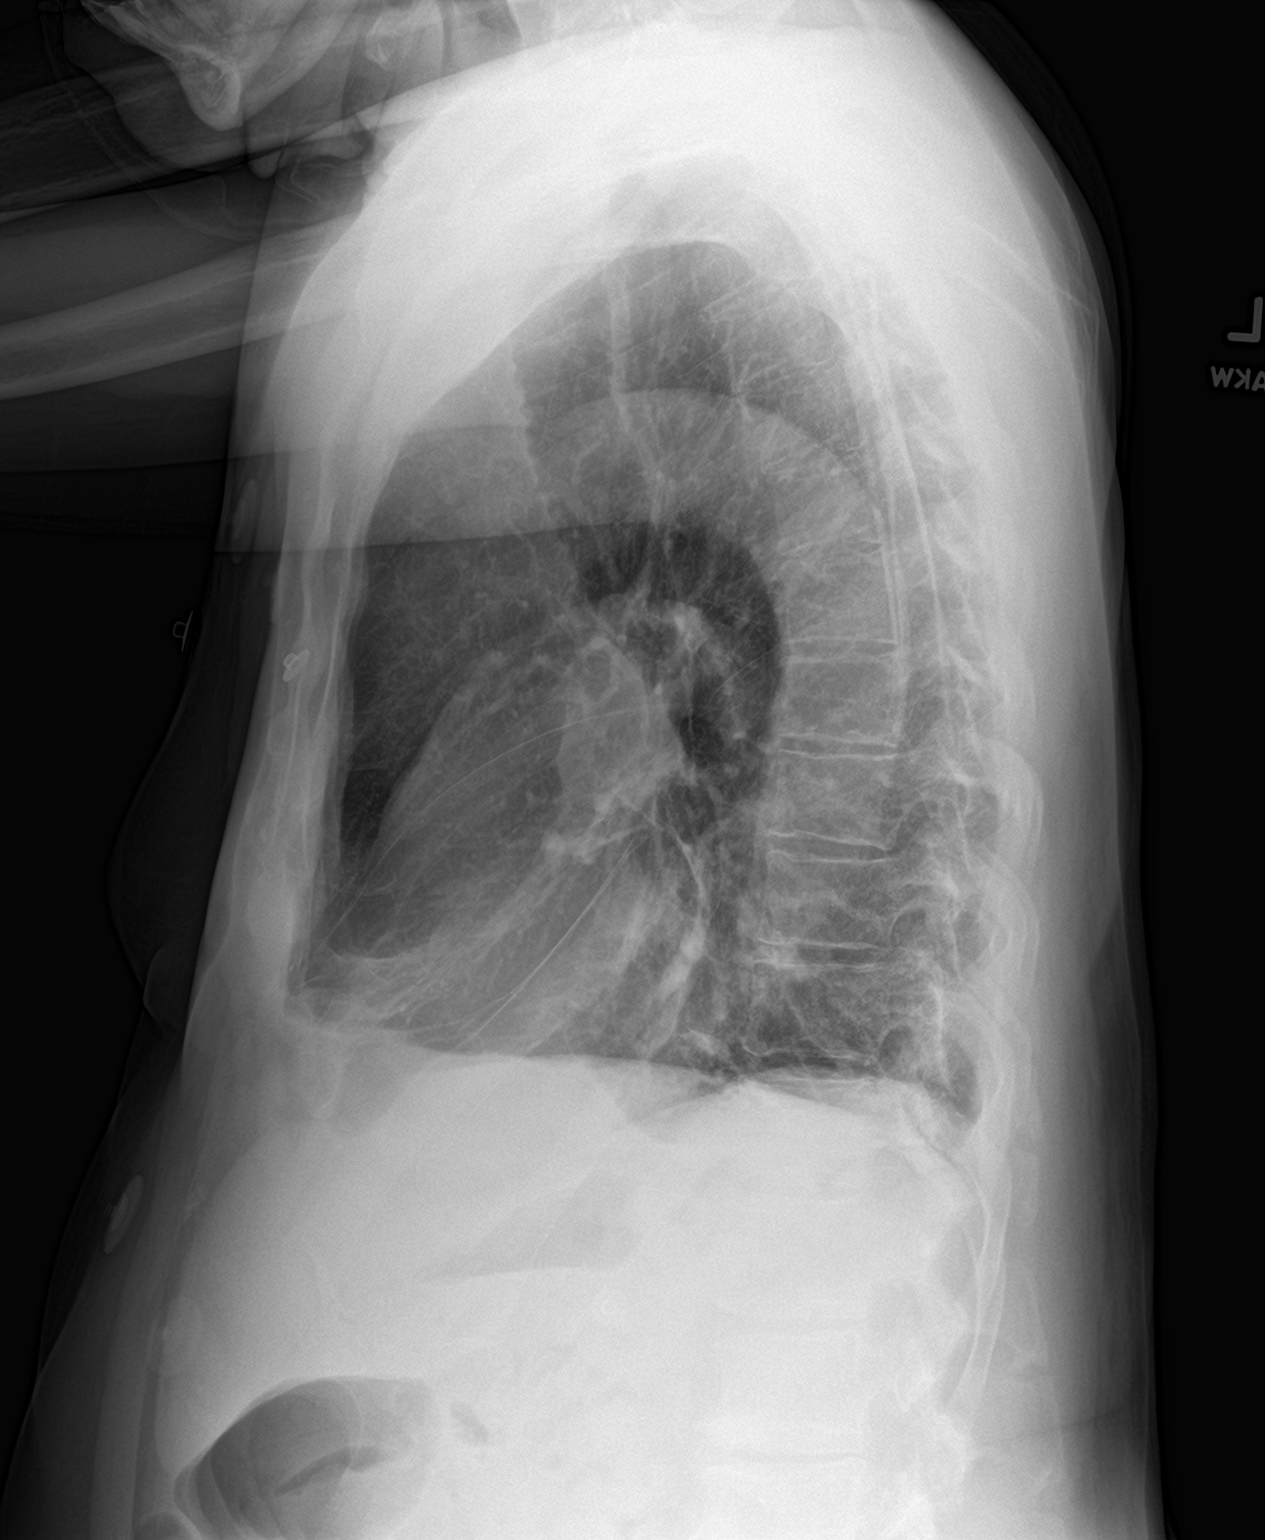

[2 of 2 positions shown; findings below may reference images not displayed]

FINDINGS: The lungs are mildly hyperinflated in keeping with changes of
underlying COPD. Mild right middle lobe scarring. No confluent
pulmonary infiltrate. No pneumothorax or pleural effusion. Cardiac
size within normal limits. The pulmonary vascularity is normal. No
acute bone abnormality
IMPRESSION: No active cardiopulmonary disease.
